# Patient Record
Sex: Female | Born: 1935 | Race: White | Hispanic: No | Marital: Married | Smoking: Former smoker
Health system: Southern US, Community
[De-identification: ages and names within clinical notes are randomized; demographics above are authoritative.]

## PROBLEM LIST (undated history)

## (undated) DIAGNOSIS — I639 Cerebral infarction, unspecified: Secondary | ICD-10-CM

## (undated) DIAGNOSIS — R519 Headache, unspecified: Secondary | ICD-10-CM

## (undated) DIAGNOSIS — R51 Headache: Secondary | ICD-10-CM

## (undated) DIAGNOSIS — F419 Anxiety disorder, unspecified: Secondary | ICD-10-CM

## (undated) DIAGNOSIS — E039 Hypothyroidism, unspecified: Secondary | ICD-10-CM

## (undated) DIAGNOSIS — K219 Gastro-esophageal reflux disease without esophagitis: Secondary | ICD-10-CM

## (undated) DIAGNOSIS — M1990 Unspecified osteoarthritis, unspecified site: Secondary | ICD-10-CM

---

## 2008-09-12 ENCOUNTER — Encounter: Admission: RE | Admit: 2008-09-12 | Discharge: 2008-09-12 | Payer: Self-pay

## 2016-07-15 ENCOUNTER — Other Ambulatory Visit: Payer: Self-pay | Admitting: Neurosurgery

## 2016-07-31 ENCOUNTER — Encounter (HOSPITAL_COMMUNITY)
Admission: RE | Admit: 2016-07-31 | Discharge: 2016-07-31 | Disposition: A | Discharge status: Home / Self Care | Payer: Medicare HMO | Source: Ambulatory Visit | Attending: Neurosurgery | Admitting: Neurosurgery

## 2016-07-31 ENCOUNTER — Encounter (HOSPITAL_COMMUNITY): Payer: Self-pay | Admitting: *Deleted

## 2016-07-31 DIAGNOSIS — M48061 Spinal stenosis, lumbar region without neurogenic claudication: Secondary | ICD-10-CM | POA: Insufficient documentation

## 2016-07-31 DIAGNOSIS — Z01818 Encounter for other preprocedural examination: Secondary | ICD-10-CM | POA: Diagnosis present

## 2016-07-31 HISTORY — DX: Headache, unspecified: R51.9

## 2016-07-31 HISTORY — DX: Hypothyroidism, unspecified: E03.9

## 2016-07-31 HISTORY — DX: Anxiety disorder, unspecified: F41.9

## 2016-07-31 HISTORY — DX: Gastro-esophageal reflux disease without esophagitis: K21.9

## 2016-07-31 HISTORY — DX: Headache: R51

## 2016-07-31 HISTORY — DX: Unspecified osteoarthritis, unspecified site: M19.90

## 2016-07-31 HISTORY — DX: Cerebral infarction, unspecified: I63.9

## 2016-07-31 LAB — BASIC METABOLIC PANEL
ANION GAP: 6 (ref 5–15)
BUN: 21 mg/dL — ABNORMAL HIGH (ref 6–20)
CALCIUM: 9 mg/dL (ref 8.9–10.3)
CO2: 21 mmol/L — ABNORMAL LOW (ref 22–32)
CREATININE: 0.83 mg/dL (ref 0.44–1.00)
Chloride: 108 mmol/L (ref 101–111)
Glucose, Bld: 94 mg/dL (ref 65–99)
Potassium: 4 mmol/L (ref 3.5–5.1)
Sodium: 135 mmol/L (ref 135–145)

## 2016-07-31 LAB — TYPE AND SCREEN
ABO/RH(D): A POS
Antibody Screen: NEGATIVE

## 2016-07-31 LAB — CBC
HCT: 35.9 % — ABNORMAL LOW (ref 36.0–46.0)
Hemoglobin: 11.8 g/dL — ABNORMAL LOW (ref 12.0–15.0)
MCH: 33 pg (ref 26.0–34.0)
MCHC: 32.9 g/dL (ref 30.0–36.0)
MCV: 100.3 fL — ABNORMAL HIGH (ref 78.0–100.0)
Platelets: 189 10*3/uL (ref 150–400)
RBC: 3.58 MIL/uL — ABNORMAL LOW (ref 3.87–5.11)
RDW: 13.6 % (ref 11.5–15.5)
WBC: 6.5 10*3/uL (ref 4.0–10.5)

## 2016-07-31 LAB — ABO/RH: ABO/RH(D): A POS

## 2016-07-31 LAB — SURGICAL PCR SCREEN
MRSA, PCR: NEGATIVE
Staphylococcus aureus: NEGATIVE

## 2016-07-31 NOTE — Pre-Procedure Instructions (Signed)
Lonni FixMargaret D Burlison  07/31/2016      [id number] - [place], [place] - [id number] [place] [id number] Phone: [phone] Fax: [phone]    Your procedure is scheduled on   Thursday  08/07/16  [facility] at 1000 A.M.  Call this number if you have problems the morning of surgery:  [phone]   Remember:  Do not eat food or drink liquids after midnight.  Take these medicines the morning of surgery with A SIP OF WATER   -  ARMOUR THYROID, OMEPRAZOLE      7 days prior to surgery STOP taking any Aspirin, Aleve, Naproxen, Ibuprofen, Motrin, Advil, Goody's, BC's, all herbal medications, fish oil, and all vitamins, SULFASALAZINE, CELECOXIB (CELEBREX)   Do not wear jewelry, make-up or nail polish.  Do not wear lotions, powders, or perfumes, or deoderant.  Do not shave 48 hours prior to surgery.  Men may shave face and neck.  Do not bring valuables to the hospital.  [facility] is not responsible for any belongings or valuables.  Contacts, dentures or bridgework may not be worn into surgery.  Leave your suitcase in the car.  After surgery it may be brought to your room.  For patients admitted to the hospital, discharge time will be determined by your treatment team.  Patients discharged the day of surgery will not be allowed to drive home.   Name and phone number of your driver:    Special instructions:  [place] - Preparing for Surgery  Before surgery, you can play an important role.  Because skin is not sterile, your skin needs to be as free of germs as possible.  You can reduce the number of germs on you skin by washing with CHG (chlorahexidine gluconate) soap before surgery.  CHG is an antiseptic cleaner which kills germs and bonds with the skin to continue killing germs even after washing.  Please DO NOT use if you have an allergy to CHG or antibacterial soaps.  If your skin becomes reddened/irritated stop using  the CHG and inform your nurse when you arrive at Short Stay.  Do not shave (including legs and underarms) for at least 48 hours prior to the first CHG shower.  You may shave your face.  Please follow these instructions carefully:   1.  Shower with CHG Soap the night before surgery and the                                morning of Surgery.  2.  If you choose to wash your hair, wash your hair first as usual with your       normal shampoo.  3.  After you shampoo, rinse your hair and body thoroughly to remove the                      Shampoo.  4.  Use CHG as you would any other liquid soap.  You can apply chg directly       to the skin and wash gently with scrungie or a clean washcloth.  5.  Apply the CHG Soap to your body ONLY FROM THE NECK DOWN.        Do not use on open wounds or open sores.  Avoid contact with your eyes,       ears, mouth and genitals (private parts).  Wash genitals (private parts)       with your normal soap.  6.  Wash thoroughly, paying special attention to the area where your surgery        will be performed.  7.  Thoroughly rinse your body with warm water from the neck down.  8.  DO NOT shower/wash with your normal soap after using and rinsing off       the CHG Soap.  9.  Pat yourself dry with a clean towel.            10.  Wear clean pajamas.            11.  Place clean sheets on your bed the night of your first shower and do not        sleep with pets.  Day of Surgery  Do not apply any lotions/deoderants the morning of surgery.  Please wear clean clothes to the hospital/surgery center.    Please read over the following fact sheets that you were given. Pain Booklet, MRSA Information and Surgical Site Infection Prevention

## 2016-08-07 ENCOUNTER — Inpatient Hospital Stay (HOSPITAL_COMMUNITY)
Admission: RE | Admit: 2016-08-07 | Discharge: 2016-08-11 | DRG: 460 | Disposition: A | Discharge status: Home Health | Payer: Medicare HMO | Source: Ambulatory Visit | Attending: Neurosurgery | Admitting: Neurosurgery

## 2016-08-07 ENCOUNTER — Inpatient Hospital Stay (HOSPITAL_COMMUNITY)
Admission: RE | Disposition: A | Discharge status: Home Health | Payer: Self-pay | Source: Ambulatory Visit | Attending: Neurosurgery

## 2016-08-07 ENCOUNTER — Inpatient Hospital Stay (HOSPITAL_COMMUNITY): Payer: Medicare HMO | Admitting: Certified Registered"

## 2016-08-07 ENCOUNTER — Encounter (HOSPITAL_COMMUNITY): Payer: Self-pay | Admitting: *Deleted

## 2016-08-07 ENCOUNTER — Inpatient Hospital Stay (HOSPITAL_COMMUNITY): Payer: Medicare HMO

## 2016-08-07 DIAGNOSIS — R339 Retention of urine, unspecified: Secondary | ICD-10-CM | POA: Diagnosis not present

## 2016-08-07 DIAGNOSIS — M48062 Spinal stenosis, lumbar region with neurogenic claudication: Secondary | ICD-10-CM | POA: Diagnosis present

## 2016-08-07 DIAGNOSIS — Z993 Dependence on wheelchair: Secondary | ICD-10-CM | POA: Diagnosis not present

## 2016-08-07 DIAGNOSIS — K219 Gastro-esophageal reflux disease without esophagitis: Secondary | ICD-10-CM | POA: Diagnosis present

## 2016-08-07 DIAGNOSIS — M549 Dorsalgia, unspecified: Secondary | ICD-10-CM | POA: Diagnosis present

## 2016-08-07 DIAGNOSIS — Z419 Encounter for procedure for purposes other than remedying health state, unspecified: Secondary | ICD-10-CM

## 2016-08-07 DIAGNOSIS — Z87891 Personal history of nicotine dependence: Secondary | ICD-10-CM

## 2016-08-07 DIAGNOSIS — E039 Hypothyroidism, unspecified: Secondary | ICD-10-CM | POA: Diagnosis present

## 2016-08-07 DIAGNOSIS — Z8673 Personal history of transient ischemic attack (TIA), and cerebral infarction without residual deficits: Secondary | ICD-10-CM | POA: Diagnosis not present

## 2016-08-07 DIAGNOSIS — M4316 Spondylolisthesis, lumbar region: Secondary | ICD-10-CM

## 2016-08-07 SURGERY — POSTERIOR LUMBAR FUSION 1 LEVEL
Anesthesia: General | Site: Spine Lumbar

## 2016-08-07 MED ORDER — OXYCODONE HCL 5 MG PO TABS
5.0000 mg | ORAL_TABLET | Freq: Four times a day (QID) | ORAL | Status: DC | PRN
  Administered 2016-08-07: 10 mg via ORAL
  Administered 2016-08-08 – 2016-08-10 (×7): 5 mg via ORAL
  Administered 2016-08-10: 10 mg via ORAL
  Administered 2016-08-10: 5 mg via ORAL
  Administered 2016-08-11: 10 mg via ORAL
  Filled 2016-08-07 (×3): qty 1
  Filled 2016-08-07: qty 2
  Filled 2016-08-07: qty 1
  Filled 2016-08-07 (×2): qty 2
  Filled 2016-08-07 (×2): qty 1
  Filled 2016-08-07: qty 2
  Filled 2016-08-07 (×2): qty 1
  Filled 2016-08-07: qty 2

## 2016-08-07 MED ORDER — ROCURONIUM BROMIDE 10 MG/ML (PF) SYRINGE
PREFILLED_SYRINGE | INTRAVENOUS | Status: AC
  Filled 2016-08-07: qty 5

## 2016-08-07 MED ORDER — 0.9 % SODIUM CHLORIDE (POUR BTL) OPTIME
TOPICAL | Status: DC | PRN
  Administered 2016-08-07 (×2): 1000 mL

## 2016-08-07 MED ORDER — HYDROMORPHONE HCL 1 MG/ML IJ SOLN
0.5000 mg | INTRAMUSCULAR | Status: DC | PRN
  Administered 2016-08-08 – 2016-08-10 (×2): 0.5 mg via INTRAVENOUS
  Filled 2016-08-07 (×2): qty 1

## 2016-08-07 MED ORDER — CEFAZOLIN SODIUM-DEXTROSE 2-4 GM/100ML-% IV SOLN
2.0000 g | INTRAVENOUS | Status: AC
  Administered 2016-08-07: 2 g via INTRAVENOUS
  Filled 2016-08-07: qty 100

## 2016-08-07 MED ORDER — SUGAMMADEX SODIUM 200 MG/2ML IV SOLN
INTRAVENOUS | Status: DC | PRN
  Administered 2016-08-07: 150 mg via INTRAVENOUS

## 2016-08-07 MED ORDER — ACETAMINOPHEN 650 MG RE SUPP: 650.0000 mg | RECTAL | Status: DC | PRN

## 2016-08-07 MED ORDER — THROMBIN 5000 UNITS EX SOLR
CUTANEOUS | Status: AC
  Filled 2016-08-07: qty 5000

## 2016-08-07 MED ORDER — PANTOPRAZOLE SODIUM 40 MG IV SOLR
40.0000 mg | Freq: Every day | INTRAVENOUS | Status: DC
  Administered 2016-08-07: 40 mg via INTRAVENOUS
  Filled 2016-08-07: qty 40

## 2016-08-07 MED ORDER — DIAZEPAM 5 MG PO TABS
5.0000 mg | ORAL_TABLET | Freq: Every day | ORAL | Status: DC
  Administered 2016-08-07 – 2016-08-10 (×4): 5 mg via ORAL
  Filled 2016-08-07 (×4): qty 1

## 2016-08-07 MED ORDER — ONDANSETRON HCL 4 MG/2ML IJ SOLN: 4.0000 mg | Freq: Four times a day (QID) | INTRAMUSCULAR | Status: DC | PRN

## 2016-08-07 MED ORDER — SODIUM CHLORIDE 0.9 % IV SOLN
250.0000 mL | INTRAVENOUS | Status: DC
  Administered 2016-08-07: 250 mL via INTRAVENOUS

## 2016-08-07 MED ORDER — ACETAMINOPHEN 325 MG PO TABS
ORAL_TABLET | ORAL | Status: AC
  Administered 2016-08-07: 650 mg via ORAL
  Filled 2016-08-07: qty 2

## 2016-08-07 MED ORDER — LIDOCAINE HCL (CARDIAC) 20 MG/ML IV SOLN
INTRAVENOUS | Status: DC | PRN
  Administered 2016-08-07: 50 mg via INTRAVENOUS

## 2016-08-07 MED ORDER — PROMETHAZINE HCL 25 MG/ML IJ SOLN: 6.2500 mg | INTRAMUSCULAR | Status: DC | PRN

## 2016-08-07 MED ORDER — SULFASALAZINE 500 MG PO TABS
1000.0000 mg | ORAL_TABLET | Freq: Two times a day (BID) | ORAL | Status: DC
  Administered 2016-08-07 – 2016-08-11 (×8): 1000 mg via ORAL
  Filled 2016-08-07 (×8): qty 2

## 2016-08-07 MED ORDER — OXYCODONE HCL 5 MG PO TABS
ORAL_TABLET | ORAL | Status: AC
  Administered 2016-08-07: 10 mg via ORAL
  Filled 2016-08-07: qty 2

## 2016-08-07 MED ORDER — PROPOFOL 10 MG/ML IV BOLUS
INTRAVENOUS | Status: DC | PRN
  Administered 2016-08-07: 110 mg via INTRAVENOUS

## 2016-08-07 MED ORDER — LACTATED RINGERS IV SOLN
INTRAVENOUS | Status: DC
  Administered 2016-08-07 (×2): via INTRAVENOUS

## 2016-08-07 MED ORDER — FENTANYL CITRATE (PF) 250 MCG/5ML IJ SOLN
INTRAMUSCULAR | Status: AC
  Filled 2016-08-07: qty 5

## 2016-08-07 MED ORDER — PANTOPRAZOLE SODIUM 40 MG PO TBEC: 40.0000 mg | DELAYED_RELEASE_TABLET | Freq: Every day | ORAL | Status: DC

## 2016-08-07 MED ORDER — DEXTROSE 5 % IV SOLN
INTRAVENOUS | Status: DC | PRN
  Administered 2016-08-07: 25 ug/min via INTRAVENOUS

## 2016-08-07 MED ORDER — VITAMIN D 1000 UNITS PO TABS
1000.0000 [IU] | ORAL_TABLET | Freq: Every day | ORAL | Status: DC
  Administered 2016-08-07 – 2016-08-11 (×5): 1000 [IU] via ORAL
  Filled 2016-08-07 (×4): qty 1

## 2016-08-07 MED ORDER — CYCLOBENZAPRINE HCL 10 MG PO TABS
10.0000 mg | ORAL_TABLET | Freq: Three times a day (TID) | ORAL | Status: DC | PRN
  Administered 2016-08-07 – 2016-08-09 (×4): 10 mg via ORAL
  Filled 2016-08-07 (×4): qty 1

## 2016-08-07 MED ORDER — PROPOFOL 10 MG/ML IV BOLUS
INTRAVENOUS | Status: AC
  Filled 2016-08-07: qty 20

## 2016-08-07 MED ORDER — FENTANYL CITRATE (PF) 100 MCG/2ML IJ SOLN
INTRAMUSCULAR | Status: DC | PRN
  Administered 2016-08-07 (×3): 50 ug via INTRAVENOUS

## 2016-08-07 MED ORDER — SURGIFOAM 100 EX MISC
CUTANEOUS | Status: DC | PRN
  Administered 2016-08-07 (×2): 20 mL via TOPICAL

## 2016-08-07 MED ORDER — FENTANYL CITRATE (PF) 100 MCG/2ML IJ SOLN: 25.0000 ug | INTRAMUSCULAR | Status: DC | PRN

## 2016-08-07 MED ORDER — FENTANYL CITRATE (PF) 100 MCG/2ML IJ SOLN
INTRAMUSCULAR | Status: AC
  Administered 2016-08-07: 50 ug via INTRAVENOUS
  Filled 2016-08-07: qty 2

## 2016-08-07 MED ORDER — ONDANSETRON HCL 4 MG/2ML IJ SOLN
INTRAMUSCULAR | Status: DC | PRN
  Administered 2016-08-07: 4 mg via INTRAVENOUS

## 2016-08-07 MED ORDER — CHLORHEXIDINE GLUCONATE CLOTH 2 % EX PADS: 6.0000 | MEDICATED_PAD | Freq: Once | CUTANEOUS | Status: DC

## 2016-08-07 MED ORDER — ALUM & MAG HYDROXIDE-SIMETH 200-200-20 MG/5ML PO SUSP: 30.0000 mL | Freq: Four times a day (QID) | ORAL | Status: DC | PRN

## 2016-08-07 MED ORDER — LIDOCAINE-EPINEPHRINE 1 %-1:100000 IJ SOLN
INTRAMUSCULAR | Status: AC
  Filled 2016-08-07: qty 1

## 2016-08-07 MED ORDER — THYROID 120 MG PO TABS
120.0000 mg | ORAL_TABLET | Freq: Every day | ORAL | Status: DC
  Administered 2016-08-08 – 2016-08-11 (×4): 120 mg via ORAL
  Filled 2016-08-07 (×5): qty 1

## 2016-08-07 MED ORDER — PHENOL 1.4 % MT LIQD: 1.0000 | OROMUCOSAL | Status: DC | PRN

## 2016-08-07 MED ORDER — NORTRIPTYLINE HCL 25 MG PO CAPS
100.0000 mg | ORAL_CAPSULE | Freq: Every day | ORAL | Status: DC
  Administered 2016-08-07 – 2016-08-10 (×4): 100 mg via ORAL
  Filled 2016-08-07 (×4): qty 4

## 2016-08-07 MED ORDER — FENTANYL CITRATE (PF) 100 MCG/2ML IJ SOLN
25.0000 ug | INTRAMUSCULAR | Status: DC | PRN
  Administered 2016-08-07 (×3): 50 ug via INTRAVENOUS

## 2016-08-07 MED ORDER — ARTIFICIAL TEARS OPHTHALMIC OINT
TOPICAL_OINTMENT | OPHTHALMIC | Status: DC | PRN
  Administered 2016-08-07: 1 via OPHTHALMIC

## 2016-08-07 MED ORDER — CELECOXIB 200 MG PO CAPS: 200.0000 mg | ORAL_CAPSULE | Freq: Every day | ORAL | Status: DC | PRN

## 2016-08-07 MED ORDER — MENTHOL 3 MG MT LOZG: 1.0000 | LOZENGE | OROMUCOSAL | Status: DC | PRN

## 2016-08-07 MED ORDER — ONDANSETRON HCL 4 MG/2ML IJ SOLN
INTRAMUSCULAR | Status: AC
  Filled 2016-08-07: qty 2

## 2016-08-07 MED ORDER — MEPERIDINE HCL 25 MG/ML IJ SOLN: 6.2500 mg | INTRAMUSCULAR | Status: DC | PRN

## 2016-08-07 MED ORDER — LACTATED RINGERS IV SOLN: INTRAVENOUS | Status: DC

## 2016-08-07 MED ORDER — MIDAZOLAM HCL 2 MG/2ML IJ SOLN
INTRAMUSCULAR | Status: AC
  Filled 2016-08-07: qty 2

## 2016-08-07 MED ORDER — THROMBIN 20000 UNITS EX SOLR
CUTANEOUS | Status: AC
  Filled 2016-08-07: qty 20000

## 2016-08-07 MED ORDER — CEFAZOLIN SODIUM-DEXTROSE 2-4 GM/100ML-% IV SOLN
2.0000 g | Freq: Three times a day (TID) | INTRAVENOUS | Status: AC
  Administered 2016-08-07 – 2016-08-09 (×6): 2 g via INTRAVENOUS
  Filled 2016-08-07 (×6): qty 100

## 2016-08-07 MED ORDER — ACETAMINOPHEN 325 MG PO TABS
650.0000 mg | ORAL_TABLET | ORAL | Status: DC | PRN
  Administered 2016-08-07 – 2016-08-08 (×2): 650 mg via ORAL
  Filled 2016-08-07 (×2): qty 2

## 2016-08-07 MED ORDER — BACITRACIN 50000 UNITS IM SOLR
INTRAMUSCULAR | Status: DC | PRN
  Administered 2016-08-07: 500 mL

## 2016-08-07 MED ORDER — LIDOCAINE 2% (20 MG/ML) 5 ML SYRINGE
INTRAMUSCULAR | Status: AC
  Filled 2016-08-07: qty 5

## 2016-08-07 MED ORDER — COD LIVER OIL PO CAPS: 1.0000 | ORAL_CAPSULE | Freq: Every day | ORAL | Status: DC

## 2016-08-07 MED ORDER — PHENYLEPHRINE HCL 10 MG/ML IJ SOLN
INTRAMUSCULAR | Status: AC
  Filled 2016-08-07: qty 1

## 2016-08-07 MED ORDER — DEXAMETHASONE SODIUM PHOSPHATE 10 MG/ML IJ SOLN
10.0000 mg | INTRAMUSCULAR | Status: DC
  Filled 2016-08-07: qty 1

## 2016-08-07 MED ORDER — VANCOMYCIN HCL 1000 MG IV SOLR
INTRAVENOUS | Status: DC | PRN
  Administered 2016-08-07: 1000 mg via TOPICAL

## 2016-08-07 MED ORDER — BUTALBITAL-APAP-CAFFEINE 50-325-40 MG PO TABS: 1.0000 | ORAL_TABLET | Freq: Four times a day (QID) | ORAL | Status: DC | PRN

## 2016-08-07 MED ORDER — ACETAZOLAMIDE 250 MG PO TABS
250.0000 mg | ORAL_TABLET | Freq: Two times a day (BID) | ORAL | Status: DC
  Administered 2016-08-07 – 2016-08-11 (×8): 250 mg via ORAL
  Filled 2016-08-07 (×8): qty 1

## 2016-08-07 MED ORDER — ONDANSETRON HCL 4 MG PO TABS: 4.0000 mg | ORAL_TABLET | Freq: Four times a day (QID) | ORAL | Status: DC | PRN

## 2016-08-07 MED ORDER — SODIUM CHLORIDE 0.9% FLUSH
3.0000 mL | Freq: Two times a day (BID) | INTRAVENOUS | Status: DC
  Administered 2016-08-07 – 2016-08-09 (×3): 3 mL via INTRAVENOUS
  Administered 2016-08-09 – 2016-08-10 (×2): 10 mL via INTRAVENOUS
  Administered 2016-08-10 – 2016-08-11 (×2): 3 mL via INTRAVENOUS

## 2016-08-07 MED ORDER — SODIUM CHLORIDE 0.9% FLUSH: 3.0000 mL | INTRAVENOUS | Status: DC | PRN

## 2016-08-07 MED ORDER — ROCURONIUM BROMIDE 100 MG/10ML IV SOLN
INTRAVENOUS | Status: DC | PRN
  Administered 2016-08-07 (×3): 50 mg via INTRAVENOUS

## 2016-08-07 MED ORDER — THROMBIN 5000 UNITS EX SOLR
OROMUCOSAL | Status: DC | PRN
  Administered 2016-08-07: 5 mL via TOPICAL

## 2016-08-07 MED ORDER — BUPIVACAINE LIPOSOME 1.3 % IJ SUSP
20.0000 mL | Freq: Once | INTRAMUSCULAR | Status: AC
  Administered 2016-08-07: 20 mL
  Filled 2016-08-07: qty 20

## 2016-08-07 MED ORDER — VANCOMYCIN HCL 1000 MG IV SOLR
INTRAVENOUS | Status: AC
  Filled 2016-08-07: qty 1000

## 2016-08-07 MED ORDER — BUPIVACAINE HCL (PF) 0.25 % IJ SOLN
INTRAMUSCULAR | Status: AC
  Filled 2016-08-07: qty 30

## 2016-08-07 SURGICAL SUPPLY — 81 items
BAG DECANTER FOR FLEXI CONT (MISCELLANEOUS) ×3 IMPLANT
BENZOIN TINCTURE PRP APPL 2/3 (GAUZE/BANDAGES/DRESSINGS) ×3 IMPLANT
BLADE CLIPPER SURG (BLADE) IMPLANT
BLADE SURG 11 STRL SS (BLADE) ×3 IMPLANT
BONE VIVIGEN FORMABLE 5.4CC (Bone Implant) ×3 IMPLANT
BUR CUTTER 7.0 ROUND (BURR) ×3 IMPLANT
BUR MATCHSTICK NEURO 3.0 LAGG (BURR) ×3 IMPLANT
CANISTER SUCT 3000ML PPV (MISCELLANEOUS) ×3 IMPLANT
CAP LOCKING (Cap) ×8 IMPLANT
CAP LOCKING 5.5 CREO (Cap) ×4 IMPLANT
CARTRIDGE OIL MAESTRO DRILL (MISCELLANEOUS) ×1 IMPLANT
CLOSURE WOUND 1/2 X4 (GAUZE/BANDAGES/DRESSINGS) ×2
CONT SPEC 4OZ CLIKSEAL STRL BL (MISCELLANEOUS) ×6 IMPLANT
COVER BACK TABLE 60X90IN (DRAPES) ×3 IMPLANT
DECANTER SPIKE VIAL GLASS SM (MISCELLANEOUS) ×6 IMPLANT
DERMABOND ADVANCED (GAUZE/BANDAGES/DRESSINGS) ×2
DERMABOND ADVANCED .7 DNX12 (GAUZE/BANDAGES/DRESSINGS) ×1 IMPLANT
DIFFUSER DRILL AIR PNEUMATIC (MISCELLANEOUS) ×3 IMPLANT
DRAPE C-ARM 42X72 X-RAY (DRAPES) ×3 IMPLANT
DRAPE C-ARMOR (DRAPES) ×3 IMPLANT
DRAPE HALF SHEET 40X57 (DRAPES) IMPLANT
DRAPE LAPAROTOMY 100X72X124 (DRAPES) ×3 IMPLANT
DRAPE POUCH INSTRU U-SHP 10X18 (DRAPES) ×3 IMPLANT
DRAPE SURG 17X23 STRL (DRAPES) ×3 IMPLANT
DRSG OPSITE 4X5.5 SM (GAUZE/BANDAGES/DRESSINGS) ×3 IMPLANT
DRSG OPSITE POSTOP 4X6 (GAUZE/BANDAGES/DRESSINGS) ×3 IMPLANT
DURAPREP 26ML APPLICATOR (WOUND CARE) ×3 IMPLANT
ELECT REM PT RETURN 9FT ADLT (ELECTROSURGICAL) ×3
ELECTRODE REM PT RTRN 9FT ADLT (ELECTROSURGICAL) ×1 IMPLANT
EVACUATOR 3/16  PVC DRAIN (DRAIN) ×2
EVACUATOR 3/16 PVC DRAIN (DRAIN) ×1 IMPLANT
GAUZE SPONGE 4X4 12PLY STRL (GAUZE/BANDAGES/DRESSINGS) ×3 IMPLANT
GAUZE SPONGE 4X4 16PLY XRAY LF (GAUZE/BANDAGES/DRESSINGS) IMPLANT
GLOVE BIO SURGEON STRL SZ7 (GLOVE) IMPLANT
GLOVE BIO SURGEON STRL SZ8 (GLOVE) ×9 IMPLANT
GLOVE BIOGEL PI IND STRL 7.0 (GLOVE) ×2 IMPLANT
GLOVE BIOGEL PI IND STRL 8 (GLOVE) ×4 IMPLANT
GLOVE BIOGEL PI IND STRL 8.5 (GLOVE) ×1 IMPLANT
GLOVE BIOGEL PI INDICATOR 7.0 (GLOVE) ×4
GLOVE BIOGEL PI INDICATOR 8 (GLOVE) ×8
GLOVE BIOGEL PI INDICATOR 8.5 (GLOVE) ×2
GLOVE ECLIPSE 7.5 STRL STRAW (GLOVE) ×9 IMPLANT
GLOVE EXAM NITRILE LRG STRL (GLOVE) IMPLANT
GLOVE EXAM NITRILE XL STR (GLOVE) IMPLANT
GLOVE EXAM NITRILE XS STR PU (GLOVE) IMPLANT
GLOVE INDICATOR 8.5 STRL (GLOVE) ×6 IMPLANT
GLOVE SURG SS PI 6.5 STRL IVOR (GLOVE) ×6 IMPLANT
GOWN STRL REUS W/ TWL LRG LVL3 (GOWN DISPOSABLE) ×1 IMPLANT
GOWN STRL REUS W/ TWL XL LVL3 (GOWN DISPOSABLE) ×3 IMPLANT
GOWN STRL REUS W/TWL 2XL LVL3 (GOWN DISPOSABLE) ×12 IMPLANT
GOWN STRL REUS W/TWL LRG LVL3 (GOWN DISPOSABLE) ×2
GOWN STRL REUS W/TWL XL LVL3 (GOWN DISPOSABLE) ×6
HEMOSTAT POWDER KIT SURGIFOAM (HEMOSTASIS) ×3 IMPLANT
KIT BASIN OR (CUSTOM PROCEDURE TRAY) ×3 IMPLANT
KIT INFUSE XX SMALL 0.7CC (Orthopedic Implant) ×3 IMPLANT
KIT ROOM TURNOVER OR (KITS) ×3 IMPLANT
NEEDLE HYPO 21X1.5 SAFETY (NEEDLE) ×3 IMPLANT
NEEDLE HYPO 25X1 1.5 SAFETY (NEEDLE) ×3 IMPLANT
NS IRRIG 1000ML POUR BTL (IV SOLUTION) ×6 IMPLANT
OIL CARTRIDGE MAESTRO DRILL (MISCELLANEOUS) ×3
PACK LAMINECTOMY NEURO (CUSTOM PROCEDURE TRAY) ×3 IMPLANT
PAD ARMBOARD 7.5X6 YLW CONV (MISCELLANEOUS) ×15 IMPLANT
PATTIES SURGICAL 1X1 (DISPOSABLE) ×3 IMPLANT
ROD 40MM SPINAL (Rod) ×6 IMPLANT
SCREW CORT CREO 6.0-5.0X30MM (Screw) ×6 IMPLANT
SHAFT CREO 30MM (Neuro Prosthesis/Implant) ×6 IMPLANT
SPACER SUSTAIN 12X26 10MM (Spacer) ×6 IMPLANT
SPONGE LAP 4X18 X RAY DECT (DISPOSABLE) IMPLANT
SPONGE SURGIFOAM ABS GEL 100 (HEMOSTASIS) ×6 IMPLANT
STRIP CLOSURE SKIN 1/2X4 (GAUZE/BANDAGES/DRESSINGS) ×4 IMPLANT
SUT VIC AB 0 CT1 18XCR BRD8 (SUTURE) ×1 IMPLANT
SUT VIC AB 0 CT1 8-18 (SUTURE) ×2
SUT VIC AB 2-0 CT1 18 (SUTURE) ×3 IMPLANT
SUT VIC AB 4-0 PS2 27 (SUTURE) ×3 IMPLANT
SYR CONTROL 10ML LL (SYRINGE) ×3 IMPLANT
SYRINGE 20CC LL (MISCELLANEOUS) ×3 IMPLANT
TOWEL GREEN STERILE (TOWEL DISPOSABLE) ×2 IMPLANT
TOWEL GREEN STERILE FF (TOWEL DISPOSABLE) ×3 IMPLANT
TRAY FOLEY W/METER SILVER 16FR (SET/KITS/TRAYS/PACK) ×3 IMPLANT
TULIP CREP AMP 5.5MM (Orthopedic Implant) ×6 IMPLANT
WATER STERILE IRR 1000ML POUR (IV SOLUTION) ×3 IMPLANT

## 2016-08-07 NOTE — H&P (Signed)
Tricia FixMargaret D Castillo is an 83 y.o. female.   Chief Complaint: Back pain and neurogenic claudication HPI: Patient is a very pleasant 83 year old female is a progress worsening back pain and neurogenic claudication with difficulty ambulating to wear she's become wheelchair bound. Workup has revealed severe spinal stenosis with complete CSF block at L4-5. Due to her progressive clinical syndrome imaging findings and failure conservative treatment I recommended decompression stable position procedure at L4-5. I've extensively gone over the risks and benefits of the operation with the patient as well as perioperative course expectations of outcome and alternatives surgery and she understands and agrees to proceed forward.  Past Medical History:  Diagnosis Date  . Anxiety   . Arthritis   . GERD (gastroesophageal reflux disease)   . Headache   . Hypothyroidism   . Stroke (HCC)    10-15 YRS AGO     SEEN ON MRI  POSSIBLY HAD SMALL STROKE     No past surgical history on file.  No family history on file. Social History:  reports that she has quit smoking. She does not have any smokeless tobacco history on file. She reports that she does not drink alcohol or use drugs.  Allergies:  Allergies  Allergen Reactions  . No Known Allergies     Medications Prior to Admission  Medication Sig Dispense Refill  . acetaZOLAMIDE (DIAMOX) 250 MG tablet Take 250 mg by mouth 2 (two) times daily.    Mack Guise. ARMOUR THYROID 120 MG tablet Take 120 mg by mouth daily before breakfast.    . butalbital-acetaminophen-caffeine (FIORICET, ESGIC) 50-325-40 MG tablet Take 1 tablet by mouth every 6 (six) hours as needed. For migraine pain/headache    . CALCIUM PO Take 1 tablet by mouth daily.    . celecoxib (CELEBREX) 200 MG capsule Take 200 mg by mouth daily as needed. For arthritis pain.  4  . Cholecalciferol (VITAMIN D3 PO) Take 1 tablet by mouth daily.    Marland Kitchen. Cod Liver Oil CAPS Take 1 capsule by mouth daily.    . diazepam  (VALIUM) 5 MG tablet Take 5 mg by mouth at bedtime.  3  . Multiple Vitamins-Minerals (CENTRUM SILVER PO) Take 1 tablet by mouth daily.    . Multiple Vitamins-Minerals (OPTIVITE PO) Take 1 tablet by mouth daily.    . nortriptyline (PAMELOR) 50 MG capsule Take 100 mg by mouth at bedtime.  9  . omeprazole (PRILOSEC) 20 MG capsule Take 20 mg by mouth every morning. 4 hrs apart from armour thyroid    . sulfaSALAzine (AZULFIDINE) 500 MG tablet Take 1,000 mg by mouth 2 (two) times daily.    Marland Kitchen. VITAMIN E PO Take 1 capsule by mouth daily.      No results found for this or any previous visit (from the past 48 hour(s)). No results found.  Review of Systems  Musculoskeletal: Positive for back pain, joint pain and myalgias.  Neurological: Positive for tingling and sensory change.    There were no vitals taken for this visit. Physical Exam  Constitutional: She is oriented to person, place, and time. She appears well-developed and well-nourished.  HENT:  Head: Normocephalic.  Eyes: Pupils are equal, round, and reactive to light.  Neck: Normal range of motion.  Respiratory: Effort normal.  GI: Soft.  Neurological: She is alert and oriented to person, place, and time. She has normal strength. GCS eye subscore is 4. GCS verbal subscore is 5. GCS motor subscore is 6.  Strength is 5 out of  5 iliopsoas, quads, hamstrings, gastric, and tibialis, EHL.     Assessment/Plan 83 year-old presents for an L4-5 posterior lumbar interbody fusion  Keiko Myricks P, MD 08/07/2016, 10:45 AM

## 2016-08-07 NOTE — Anesthesia Procedure Notes (Signed)
Procedure Name: Intubation Date/Time: 08/07/2016 11:28 AM Performed by: Willeen Cass P Pre-anesthesia Checklist: Patient identified, Emergency Drugs available, Suction available and Patient being monitored Patient Re-evaluated:Patient Re-evaluated prior to inductionOxygen Delivery Method: Circle System Utilized Preoxygenation: Pre-oxygenation with 100% oxygen Intubation Type: IV induction Ventilation: Mask ventilation without difficulty Laryngoscope Size: Mac and 3 Grade View: Grade I Tube type: Oral Tube size: 7.0 mm Number of attempts: 1 Airway Equipment and Method: Stylet and Oral airway Placement Confirmation: ETT inserted through vocal cords under direct vision,  positive ETCO2 and breath sounds checked- equal and bilateral Secured at: 22 cm Tube secured with: Tape Dental Injury: Teeth and Oropharynx as per pre-operative assessment

## 2016-08-07 NOTE — Progress Notes (Signed)
Pt admitted to 5M1 from PACU.  She is alert and oriented.  She denies pain at present, but is nauseous and vomited x 1.  Dressing CDI. Hemovac and foley draining.  Family at bedside.

## 2016-08-07 NOTE — Anesthesia Postprocedure Evaluation (Signed)
Anesthesia Post Note  Patient: Tricia Castillo  Procedure(s) Performed: Procedure(s) (LRB): POSTERIOR LUMBAR INTERBODY FUSION LUMBAR FOUR-FIVE (N/A)  Patient location during evaluation: PACU Anesthesia Type: General Level of consciousness: awake and alert Pain management: pain level controlled Vital Signs Assessment: post-procedure vital signs reviewed and stable Respiratory status: spontaneous breathing, nonlabored ventilation, respiratory function stable and patient connected to nasal cannula oxygen Cardiovascular status: blood pressure returned to baseline and stable Postop Assessment: no signs of nausea or vomiting Anesthetic complications: no       Last Vitals:  Vitals:   08/07/16 1615 08/07/16 1630  BP:  134/62  Pulse: 69 68  Resp: 13 16  Temp:                    Shelton SilvasKevin D Caralee Morea

## 2016-08-07 NOTE — Transfer of Care (Signed)
Immediate Anesthesia Transfer of Care Note  Patient: Lonni FixMargaret D Naves  Procedure(s) Performed: Procedure(s): POSTERIOR LUMBAR INTERBODY FUSION LUMBAR FOUR-FIVE (N/A)  Patient Location: PACU  Anesthesia Type:General  Level of Consciousness: awake, alert , oriented and patient cooperative  Airway & Oxygen Therapy: Patient Spontanous Breathing and Patient connected to nasal cannula oxygen  Post-op Assessment: Report given to RN and Post -op Vital signs reviewed and stable  Post vital signs: Reviewed and stable  Last Vitals:  Vitals:   08/07/16 1046  BP: (!) 147/85  Pulse: 89  Resp: 18  Temp: 36.5 C    Last Pain:  Vitals:   08/07/16 1047  TempSrc:   PainSc: 5       Patients Stated Pain Goal: 3 (08/07/16 1047)  Complications: No apparent anesthesia complications

## 2016-08-07 NOTE — Op Note (Signed)
Preoperative diagnosis: Grade 1 spondylolisthesis L4-5 with severe spinal stenosis and bilateral L4-L5 radicular use with neurogenic claudication  Postoperative diagnosis: Same  Procedure: #1 decompressive lumbar laminectomy L4-5 in excess and require more workup would be needed with a standard interbody fusion with complete medial facetectomies radical foraminotomies of the L4 and L5 nerve roots.  #2 posterior lumbar interbody fusion L4-5 utilizing the globus Peek cages packed with locally harvested our graft mixed with vivigen  #3 cortical screw fixation L4-5 using the globus Creo modular cortical screws  Surgeon:  Assistant: Maeola HarmanJoseph Stern anesthesia: Gen.  EBL: Minimal  History of present illness: Patient is a very pleasant 83 year old female who is at long same back and bilateral leg pain with neurogenic claudication and severe spinal stenosis and a virtual complete myelographic block by MRI criteria. Workup has revealed severe spinal stenosis and block at L4-5 with slight listhesis. 2 patient take conservative treatment imaging findings and progressive clinical syndrome I recommended decompression stabilization procedure at L4-5. I extensively reviewed the risks and benefits of the operation with the patient as well as perioperative course expectations of outcome and alternatives of surgery and she and the family understood and agreed to C4.  Operative procedure: Patient regarding the ER was induced under general anesthesia and positioned prone on the Wilson frame and her back was prepped and draped in routine sterile fashion after infiltration of 10 mL lidocaine with epi midline incision was made and Bovie electrocautery was used to dissect through the subcutaneous tissue and Subperiosteal Dissection Was Carried out on the Lamina of L4 and L5. Intraoperative X-Ray Confirmed Identification of the of the Appropriate Level. At this point the spinous process of L4 was removed the central  decompression was begun there was marked stenosis and shingling of the L4 lamina over the L5 lamina after [place] after complete central decompression was performed completely facetectomies were performed I dissected the dura off of the undersurface of medial facets that was causing severe hourglass compression of thecal sac and a large degenerated synovial cyst on the patient's right side causing marked stenosis. All this was removed and the thecal sac was decompressed both L4 and L5 foramen were widely decompressed decompressing for the 5 roots. At this point attempts to communicate interbody work on aggressive disc space cleanout was carried out bilaterally and utilizing sequential distractions with 11 distractor blades a selected 10 mm cages packed with local autograft mixed and inserted a prior contralateral cage insertion packed centrally the autograft mix as well as BMP. Then after all the implants were placed well and confirmed by fluoroscopy this also reduced the deformity. I placed cortical screws in routine fashion with 30 mm screws. I then anchored just assembled the heads and placed the rods slightly compress the L4-L5 screw inspected the foramina to confirm patency leg Gelfoam over the dura place a large Hemovac drain and sprinkled vancomycin powder and injected experell on the fascia. The wound was then closed with interrupted Vicryl and running 4 subcuticular Dermabond benzoin Steri-Strips and sterile dressings applied patient to cover stable condition. At the end of the case all needle counts and sponge counts were correct.

## 2016-08-07 NOTE — Anesthesia Preprocedure Evaluation (Addendum)
Anesthesia Evaluation  Patient identified by MRN, date of birth, ID band Patient awake    Reviewed: Allergy & Precautions, NPO status , Patient's Chart, lab work & pertinent test results  Airway Mallampati: II       Dental  (+) Missing, Dental Advisory Given, Partial Upper   Pulmonary neg pulmonary ROS, former smoker,    breath sounds clear to auscultation       Cardiovascular negative cardio ROS   Rhythm:Regular Rate:Normal     Neuro/Psych  Headaches, Anxiety CVA negative neurological ROS     GI/Hepatic Neg liver ROS, GERD  Medicated,  Endo/Other  Hypothyroidism   Renal/GU negative Renal ROS  negative genitourinary   Musculoskeletal  (+) Arthritis , Osteoarthritis,    Abdominal   Peds negative pediatric ROS (+)  Hematology negative hematology ROS (+)   Anesthesia Other Findings Day of surgery medications reviewed with the patient.  Reproductive/Obstetrics negative OB ROS                            Anesthesia Physical Anesthesia Plan  ASA: II  Anesthesia Plan: General   Post-op Pain Management:    Induction: Intravenous  Airway Management Planned: Oral ETT  Additional Equipment:   Intra-op Plan:   Post-operative Plan: Extubation in OR  Informed Consent: I have reviewed the patients History and Physical, chart, labs and discussed the procedure including the risks, benefits and alternatives for the proposed anesthesia with the patient or authorized representative who has indicated his/her understanding and acceptance.   Dental advisory given  Plan Discussed with: CRNA  Anesthesia Plan Comments:         Anesthesia Quick Evaluation

## 2016-08-08 MED ORDER — PANTOPRAZOLE SODIUM 40 MG PO TBEC
40.0000 mg | DELAYED_RELEASE_TABLET | Freq: Every day | ORAL | Status: DC
  Administered 2016-08-08 – 2016-08-10 (×3): 40 mg via ORAL
  Filled 2016-08-08 (×3): qty 1

## 2016-08-08 MED FILL — Thrombin For Soln 20000 Unit: CUTANEOUS | Qty: 1 | Status: AC

## 2016-08-08 NOTE — Evaluation (Signed)
Physical Therapy Evaluation Patient Details Name: Tricia Castillo MRN: [id number] DOB: 1933-07-01 Today's Date: 08/08/2016   History of Present Illness  Pt is an 83 y/o female s/p L4-L5 PLIF. PMH including but not limited to  Clinical Impression  Pt presented supine in bed with HOB elevated, awake and willing to participate in therapy session. Prior to admission, pt reported that she was mod I with functional mobility with use of SPC to ambulate within her home. Pt also reporting that she was independent with ADLs. Pt lives in a single-level house with a level entry with her husband who can provide 24/7 supervision. Pt requires min A for bed mobility, min guard for transfers and min guard with ambulation using a RW. Pt would continue to benefit from skilled physical therapy services at this time while admitted and after d/c to address the below listed limitations in order to improve overall safety and independence with functional mobility.     Follow Up Recommendations Home health PT;Supervision/Assistance - 24 hour    Equipment Recommendations  None recommended by PT;Other (comment) (pt has all necessary DME at home)    Recommendations for Other Services       Precautions / Restrictions Precautions Precautions: Fall;Back Precaution Booklet Issued: Yes (comment) Precaution Comments: PT reviewed 3/3 back precautions and log roll technique for bed mobility with pt. Required Braces or Orthoses:  ("no brace needed" orders) Restrictions Weight Bearing Restrictions: No      Mobility  Bed Mobility Overal bed mobility: Needs Assistance Bed Mobility: Rolling;Sidelying to Sit Rolling: Min guard Sidelying to sit: Min assist       General bed mobility comments: increased time and effort, vc'ing for technique, use of bed rails, min A to elevate trunk to achieve sitting EOB  Transfers Overall transfer level: Needs assistance Equipment used: Rolling walker (2 wheeled) Transfers: Sit  to/from [employer] Sit to Stand: Min guard Stand pivot transfers: Min guard       General transfer comment: increased time, good hand placement, min guard for safety with vc'ing for postural corrections  Ambulation/Gait Ambulation/Gait assistance: Min guard Ambulation Distance (Feet): 2 Feet Assistive device: Rolling walker (2 wheeled) Gait Pattern/deviations: Step-to pattern;Step-through pattern;Decreased step length - right;Decreased step length - left;Decreased stride length;Shuffle;Trunk flexed Gait velocity: decreased Gait velocity interpretation: Below normal speed for age/gender General Gait Details: modest instability but no overt LOB or need for physical assistance. pt with shuffling gait, minimal foot clearance and flexed trunk. VC'ing for postural corrections  Stairs            Wheelchair Mobility    Modified Rankin (Stroke Patients Only)       Balance Overall balance assessment: Needs assistance Sitting-balance support: Feet supported Sitting balance-Leahy Scale: Fair     Standing balance support: During functional activity;Bilateral upper extremity supported Standing balance-Leahy Scale: Poor Standing balance comment: pt reliant on bilateral UEs on RW                             Pertinent Vitals/Pain Pain Assessment: 0-10 Pain Score: 5  Pain Location: back Pain Descriptors / Indicators: Sore Pain Intervention(s): Monitored during session;Repositioned    Home Living Family/patient expects to be discharged to:: Private residence Living Arrangements: Spouse/significant other Available Help at Discharge: Family;Available 24 hours/day Type of Home: House Home Access: Level entry     Home Layout: One level Home Equipment: Walker - 2 wheels;Cane - single point;Bedside commode;Shower seat;Wheelchair -  manual      Prior Function Level of Independence: Independent with assistive device(s)         Comments: pt reported  that she [facility] and had assistance from her husband when ambulating within the community (for safety/comfort). Pt reported that she was independent with ADLs.     Hand Dominance   Dominant Hand: Right    Extremity/Trunk Assessment   Upper Extremity Assessment Upper Extremity Assessment: Defer to OT evaluation    Lower Extremity Assessment Lower Extremity Assessment: Generalized weakness    Cervical / Trunk Assessment Cervical / Trunk Assessment: Other exceptions Cervical / Trunk Exceptions: s/p lumbar sx  Communication   Communication: No difficulties  Cognition Arousal/Alertness: Awake/alert Behavior During Therapy: WFL for tasks assessed/performed Overall Cognitive Status: Within Functional Limits for tasks assessed                                        General Comments      Exercises     Assessment/Plan    PT Assessment Patient needs continued PT services  PT Problem List Decreased strength;Decreased activity tolerance;Decreased balance;Decreased mobility;Decreased coordination;Decreased safety awareness;Decreased knowledge of use of DME;Decreased knowledge of precautions;Pain       PT Treatment Interventions DME instruction;Gait training;Stair training;Functional mobility training;Therapeutic activities;Therapeutic exercise;Balance training;Neuromuscular re-education;Patient/family education    PT Goals (Current goals can be found in the Care Plan section)  Acute Rehab PT Goals Patient Stated Goal: return home PT Goal Formulation: With patient Time For Goal Achievement: 08/22/16 Potential to Achieve Goals: Fair    Frequency Min 5X/week   Barriers to discharge        Co-evaluation               AM-PAC PT "6 Clicks" Daily Activity  Outcome Measure Difficulty turning over in bed (including adjusting bedclothes, sheets and blankets)?: A Little Difficulty moving from lying on back to sitting on the side of  the bed? : Total Difficulty sitting down on and standing up from a chair with arms (e.g., wheelchair, bedside commode, etc,.)?: Total Help needed moving to and from a bed to chair (including a wheelchair)?: A Little Help needed walking in hospital room?: A Little Help needed climbing 3-5 steps with a railing? : A Lot 6 Click Score: 13    End of Session Equipment Utilized During Treatment: Gait belt Activity Tolerance: Patient tolerated treatment well Patient left: in chair;with call bell/phone within reach;with chair alarm set Nurse Communication: Mobility status PT Visit Diagnosis: Other abnormalities of gait and mobility (R26.89);Pain Pain - part of body:  (back)    Time: [id number] PT Time Calculation (min) (ACUTE ONLY): 24 min   Charges:   PT Evaluation $PT Eval Moderate Complexity: 1 Procedure PT Treatments $Therapeutic Activity: 8-22 mins   PT G Codes:        [place]ennifer Shylie Polo, PT, DPT [phone]   Alessandra BevelsJennifer M Garold Sheeler 08/08/2016, 9:10 AM

## 2016-08-08 NOTE — Progress Notes (Signed)
Subjective: Patient reports Patient doing very well condition of back pain but no leg pain manageable  Objective: Vital signs in last 24 hours: Temp:  [97.7 F (36.5 C)-98.7 F (37.1 C)] 98.7 F (37.1 C) (05/25 0519) Pulse Rate:  [68-89] 86 (05/25 0519) Resp:  [12-18] 18 (05/25 0519) BP: (118-161)/(54-85) 118/56 (05/25 0519) SpO2:  [96 %-100 %] 96 % (05/25 0519) Weight:  [61.2 kg (135 lb)] 61.2 kg (135 lb) (05/24 1046)  Intake/Output from previous day: 05/24 0701 - 05/25 0700 In: 1983 [P.O.:240; I.V.:1593; IV Piggyback:50] Out: 2255 [Urine:1930; Drains:150; Blood:175] Intake/Output this shift: No intake/output data recorded.  Strength 5 out of 5 wound clean dry and intact  Lab Results: No results for input(s): WBC, HGB, HCT, PLT in the last 72 hours. BMET No results for input(s): NA, K, CL, CO2, GLUCOSE, BUN, CREATININE, CALCIUM in the last 72 hours.  Studies/Results: Dg Lumbar Spine 2-3 Views  Result Date: 08/07/2016 CLINICAL DATA:  PLIF L4-L5 EXAM: EF lumbar spine two views COMPARISON:  Lumbar spine MR 05/02/2016 FINDINGS: Prior MRI labeled with 5 lumbar vertebra. Submitted images demonstrate BILATERAL pedicle screws with intervening disc prosthesis at L4 and L5. Bones appear diffusely demineralized. No fracture or subluxation. IMPRESSION: Intraoperative images during posterior L4-L5 fusion. Electronically Signed   By: Ulyses SouthwardMark  Boles M.D.   On: 08/07/2016 14:57   Dg C-arm 1-60 Min  Result Date: 08/07/2016 CLINICAL DATA:  Intraoperative fluoroscopy be during PLIF L4-L5 EXAM: DG C-ARM 61-120 MIN COMPARISON:  Lumbar spine two views intraoperative 08/07/2016 FLUOROSCOPY TIME:  0 minutes 38 seconds Images obtained: 2 FINDINGS: Please see lumbar spine two-view exam report. IMPRESSION: Intraoperative fluoroscopy during lumbar fusion. Electronically Signed   By: Ulyses SouthwardMark  Boles M.D.   On: 08/07/2016 15:01    Assessment/Plan: Postoperative day 1 and L4-5 interbody fusion Mobilized today  with physical occupational therapy  LOS: 1 day     Tiny Rietz P 08/08/2016, 8:03 AM

## 2016-08-08 NOTE — Evaluation (Signed)
Occupational Therapy Evaluation Patient Details Name: Tricia Castillo MRN: [id number] DOB: [birth year] Today's Date: 08/08/2016    History of Present Illness Pt is an 83 y/o female s/p L4-L5 PLIF. PMH including but not limited to   Clinical Impression   PTA Pt independent in ADL/IADL and mobility with SPC. Pt currently mod A for LB ADL and min guard assist for mobility with RW. Pt has appropriate DME at home. Please see OT problem list as Pt will benefit from skilled OT in the acute setting to maximize safety and independence in ADL. Pt will require HHOT for follow up for fall prevention, continued education for Pt and caregiver. Next session to focus on AE education. Since husband was not present session please have next OT session with him present to ensure that he is comfortable with caregiver duties.     Follow Up Recommendations  Home health OT;Supervision/Assistance - 24 hour    Equipment Recommendations  None recommended by OT (Pt has appropriate DME)    Recommendations for Other Services       Precautions / Restrictions Precautions Precautions: Fall;Back Precaution Booklet Issued: Yes (comment) Precaution Comments: Pt able to recall 2/3 precautions, re-educated using handout for visual reinforcement Required Braces or Orthoses:  ("no brace needed" orders) Restrictions Weight Bearing Restrictions: No      Mobility Bed Mobility Overal bed mobility: Needs Assistance Bed Mobility: Rolling;Sidelying to Sit Rolling: Min guard Sidelying to sit: Min assist       General bed mobility comments: increased time and effort, vc'ing for technique, use of bed rails, min A to elevate trunk to achieve sitting EOB  Transfers Overall transfer level: Needs assistance Equipment used: Rolling walker (2 wheeled) Transfers: Sit to/from [employer] Sit to Stand: Min guard Stand pivot transfers: Min guard       General transfer comment: vc to brinng hips close to  the EOB, increased time required, vc for hand placement, min guard for safety with vc'ing for postural corrections    Balance Overall balance assessment: Needs assistance Sitting-balance support: Feet supported Sitting balance-Leahy Scale: Fair     Standing balance support: During functional activity;Bilateral upper extremity supported Standing balance-Leahy Scale: Poor Standing balance comment: pt reliant on bilateral UEs on RW                           ADL either performed or assessed with clinical judgement   ADL Overall ADL's : Needs assistance/impaired Eating/Feeding: Modified independent;Sitting Eating/Feeding Details (indicate cue type and reason): in recliner Grooming: Wash/dry hands;Wash/dry face;Set up;Sitting Grooming Details (indicate cue type and reason): in recliner Upper Body Bathing: Moderate assistance;Sitting   Lower Body Bathing: Maximal assistance;Sitting/lateral leans   Upper Body Dressing : Minimal assistance;Sitting   Lower Body Dressing: Moderate assistance;Sit to/from stand   Toilet Transfer: Min Probation officer Details (indicate cue type and reason): simulated with transfer to recliner, Pt did not need to use the bathroom at this time Toileting- Clothing Manipulation and Hygiene: Minimal assistance;Sit to/from stand       Functional mobility during ADLs: Min guard;Rolling walker;Minimal assistance (min A initially for balance) General ADL Comments: Husband not present for evalutation, and will the one providing care for Pt at home. Pt states that he will be able to help with LB bathing/dressing     Vision Baseline Vision/History: Wears glasses Wears Glasses: At all times Patient Visual Report: No change from baseline Vision Assessment?: No apparent visual deficits  Perception     Praxis      Pertinent Vitals/Pain Pain Assessment: 0-10 Pain Score: 4  Pain Location: back Pain Descriptors / Indicators:  Sore Pain Intervention(s): Monitored during session;Limited activity within patient's tolerance;Repositioned     Hand Dominance Right   Extremity/Trunk Assessment Upper Extremity Assessment Upper Extremity Assessment: Generalized weakness;RUE deficits/detail RUE Deficits / Details: Pt reports that her Right arm was completely numb last night, it still feels a "little tingly" functional during eating and for seated grooming tasks. Strength WFL RUE Sensation: decreased light touch   Lower Extremity Assessment Lower Extremity Assessment: Generalized weakness   Cervical / Trunk Assessment Cervical / Trunk Assessment: Other exceptions Cervical / Trunk Exceptions: s/p lumbar sx   Communication Communication Communication: No difficulties   Cognition Arousal/Alertness: Awake/alert Behavior During Therapy: WFL for tasks assessed/performed Overall Cognitive Status: Within Functional Limits for tasks assessed                                     General Comments  Husband not present to determine caregiver abilities    Exercises     Shoulder Instructions      Home Living Family/patient expects to be discharged to:: Private residence Living Arrangements: Spouse/significant other Available Help at Discharge: Family;Available 24 hours/day Type of Home: House Home Access: Level entry     Home Layout: One level     Bathroom Shower/Tub: Tub/shower unit;Walk-in shower   Bathroom Toilet: Standard Bathroom Accessibility: Yes How Accessible: Accessible via walker Home Equipment: Walker - 2 wheels;Cane - single point;Bedside commode;Shower seat;Wheelchair - manual;Grab bars - toilet;Grab bars - tub/shower          Prior Functioning/Environment Level of Independence: Independent with assistive device(s)        Comments: pt reported that she [facility] and had assistance from her husband when ambulating within the community (for  safety/comfort). Pt reported that she was independent with ADLs.        OT Problem List: Decreased strength;Decreased range of motion;Decreased activity tolerance;Impaired balance (sitting and/or standing);Decreased safety awareness;Decreased knowledge of precautions;Pain;Impaired sensation      OT Treatment/Interventions: Self-care/ADL training;DME and/or AE instruction;Therapeutic activities;Patient/family education;Balance training    OT Goals(Current goals can be found in the care plan section) Acute Rehab OT Goals Patient Stated Goal: return home OT Goal Formulation: With patient Time For Goal Achievement: 08/22/16 Potential to Achieve Goals: Good ADL Goals Pt Will Perform Lower Body Bathing: with min guard assist;with adaptive equipment;sitting/lateral leans Pt Will Perform Lower Body Dressing: with min assist;with caregiver independent in assisting;with adaptive equipment;sit to/from stand Pt Will Transfer to Toilet: with supervision;ambulating;grab bars (with RW, caregiver independent in assisting) Pt Will Perform Toileting - Clothing Manipulation and hygiene: with supervision;sit to/from stand Additional ADL Goal #1: Pt will recall 3/3 back precautions at the beginning of the session and need 3 or less vc to maintain during the session Additional ADL Goal #2: Pt will perform bed mobility at supervision level, while maintaining back precautions  OT Frequency: Min 3X/week   Barriers to D/C:            Co-evaluation              AM-PAC PT "6 Clicks" Daily Activity     Outcome Measure Help from another person eating meals?: None Help from another person taking care of personal grooming?: A Little Help from another person toileting,  which includes using toliet, bedpan, or urinal?: A Little Help from another person bathing (including washing, rinsing, drying)?: A Lot Help from another person to put on and taking off regular upper body clothing?: A Little Help from  another person to put on and taking off regular lower body clothing?: A Lot 6 Click Score: 17   End of Session Equipment Utilized During Treatment: Gait belt;Rolling walker Nurse Communication: Mobility status  Activity Tolerance: Patient tolerated treatment well Patient left: in chair;with call bell/phone within reach;with chair alarm set  OT Visit Diagnosis: Unsteadiness on feet (R26.81);Muscle weakness (generalized) (M62.81);Other symptoms and signs involving the nervous system (R29.898);Pain Pain - Right/Left: Right Pain - part of body: Leg (back)                Time: 1610-9604 OT Time Calculation (min): 18 min Charges:  OT General Charges $OT Visit: 1 Procedure OT Evaluation $OT Eval Moderate Complexity: 1 Procedure G-Codes:     Sherryl Manges OTR/L [phone]  Evern Bio Wendle Kina 08/08/2016, 12:39 PM

## 2016-08-09 MED ORDER — WHITE PETROLATUM GEL
Status: AC
  Administered 2016-08-09: 06:00:00
  Filled 2016-08-09: qty 1

## 2016-08-09 MED ORDER — SENNOSIDES-DOCUSATE SODIUM 8.6-50 MG PO TABS
1.0000 | ORAL_TABLET | Freq: Two times a day (BID) | ORAL | Status: DC | PRN
  Administered 2016-08-10 (×2): 1 via ORAL
  Filled 2016-08-09 (×2): qty 1

## 2016-08-09 NOTE — Progress Notes (Signed)
Physical Therapy Treatment Patient Details Name: Tricia Castillo [place] MRN: [id number] DOB: 1934-03-08 Today's Date: 08/09/2016    History of Present Illness Pt is an 83 y/o female s/p L4-L5 PLIF. PMH including but not limited to hypothyroidism, hx of CVA 10-15 years ago.    PT Comments    Pt making good progress with functional mobility, able to ambulate 75' in hallway with min guard for safety with RW. Pt would continue to benefit from skilled physical therapy services at this time while admitted and after Castillo/c to address the below listed limitations in order to improve overall safety and independence with functional mobility.   Follow Up Recommendations  Home health PT;Supervision/Assistance - 24 hour     Equipment Recommendations  None recommended by PT    Recommendations for Other Services       Precautions / Restrictions Precautions Precautions: Fall;Back Precaution Booklet Issued: Yes (comment) Precaution Comments: PT reviewed all precautions and log roll technique with pt throughout session Required Braces or Orthoses:  ("no brace needed" orders) Restrictions Weight Bearing Restrictions: No    Mobility  Bed Mobility Overal bed mobility: Needs Assistance Bed Mobility: Rolling;Sit to Sidelying Rolling: Min guard       Sit to sidelying: Min guard General bed mobility comments: increased time and effort, vc'ing for technique, use of bed rails, min guard for safety  Transfers Overall transfer level: Needs assistance Equipment used: Rolling walker (2 wheeled) Transfers: Sit to/from Stand Sit to Stand: Min guard         General transfer comment: increased time, vc'ing for bilateral hand placement, min guard for safety  Ambulation/Gait Ambulation/Gait assistance: Min guard Ambulation Distance (Feet): 75 Feet Assistive device: Rolling walker (2 wheeled) Gait Pattern/deviations: Step-to pattern;Step-through pattern;Decreased step length - right;Decreased step  length - left;Decreased stride length;Shuffle;Trunk flexed Gait velocity: decreased Gait velocity interpretation: Below normal speed for age/gender General Gait Details: modest instability but no overt LOB or need for physical assistance. pt with shuffling gait, minimal foot clearance and flexed trunk. VC'ing for postural corrections   Stairs            Wheelchair Mobility    Modified Rankin (Stroke Patients Only)       Balance Overall balance assessment: Needs assistance Sitting-balance support: Feet supported Sitting balance-Leahy Scale: Fair     Standing balance support: During functional activity;Bilateral upper extremity supported Standing balance-Leahy Scale: Poor Standing balance comment: pt reliant on bilateral UEs on RW                            Cognition Arousal/Alertness: Awake/alert Behavior During Therapy: WFL for tasks assessed/performed Overall Cognitive Status: Within Functional Limits for tasks assessed                                        Exercises      General Comments        Pertinent Vitals/Pain Pain Assessment: Faces Faces Pain Scale: Hurts even more Pain Location: back Pain Descriptors / Indicators: Sore Pain Intervention(s): Monitored during session;Repositioned;Patient requesting pain meds-RN notified    Home Living                      Prior Function            PT Goals (current goals can now be found in the care plan  section) Acute Rehab PT Goals PT Goal Formulation: With patient Time For Goal Achievement: 08/22/16 Potential to Achieve Goals: Fair Progress towards PT goals: Progressing toward goals    Frequency    Min 5X/week      PT Plan Current plan remains appropriate    Co-evaluation              AM-PAC PT "6 Clicks" Daily Activity  Outcome Measure  Difficulty turning over in bed (including adjusting bedclothes, sheets and blankets)?: A Little Difficulty moving  from lying on back to sitting on the side of the bed? : A Little Difficulty sitting down on and standing up from a chair with arms (e.g., wheelchair, bedside commode, etc,.)?: Total Help needed moving to and from a bed to chair (including a wheelchair)?: A Little Help needed walking in hospital room?: A Little Help needed climbing 3-5 steps with a railing? : A Lot 6 Click Score: 15    End of Session Equipment Utilized During Treatment: Gait belt Activity Tolerance: Patient tolerated treatment well Patient left: in bed;with call bell/phone within reach;with bed alarm set Nurse Communication: Mobility status PT Visit Diagnosis: Other abnormalities of gait and mobility (R26.89);Pain Pain - part of body:  (back)     Time: 1100-1116 PT Time Calculation (min) (ACUTE ONLY): 16 min  Charges:  $Gait Training: 8-22 mins                    G Codes:       [place], [place], [place] [phone]    Alessandra Bevels Jeanmarie Mccowen 08/09/2016, 11:54 AM

## 2016-08-09 NOTE — Progress Notes (Signed)
Placed Foley catheter after pt attempted to void x 3 throughout the shift. 763 bladder scan pre insertion; 700 mL urine drained into foley bag. Pt tolerated well. Reported slight sensation of bladder fullness directly prior to foley placement, but no sensation or urgency throughout the shift. Will continue to monitor.

## 2016-08-09 NOTE — Progress Notes (Signed)
Pt refused to attempt urination; and said she would try "later after lunch". Pt reported no sensation of urinary fullness or urgency. Bladder scan showed 360 mL urine. Will continue to monitor.

## 2016-08-09 NOTE — Progress Notes (Signed)
Vitals:   08/08/16 1429 08/08/16 2202 08/09/16 0138 08/09/16 0633  BP: 135/61 133/73 132/64 140/71  Pulse: 81 87 91 90  Resp: 18 16 16 18   Temp: 98.5 F (36.9 C) 99.3 F (37.4 C) 99.3 F (37.4 C) 98.9 F (37.2 C)  TempSrc: Oral Oral Oral Oral  SpO2: 100% 96% 98% 98%  Weight:      Height:        Patient sitting up, eating. Nursing notes no significant drainage into JP drain, we'll have nursing staff remove it. Have asked him to change IV to saline lock.  Continuing PT and OT. Patient transferring from bed to chair, but not ambulating.Hasn't been yet to the bathroom or into the hallway.  Plan: Will change IV to saline lock. Encourage to work with therapies.  Hewitt ShortsNUDELMAN,ROBERT W, MD 08/09/2016, 10:41 AM

## 2016-08-10 MED ORDER — POLYETHYLENE GLYCOL 3350 17 G PO PACK
17.0000 g | PACK | Freq: Every day | ORAL | Status: DC
  Administered 2016-08-10 – 2016-08-11 (×2): 17 g via ORAL
  Filled 2016-08-10 (×2): qty 1

## 2016-08-10 NOTE — Progress Notes (Signed)
Pt attempted to void x 3 since removing the Foley catheter today at 1100.  Bladder scan showed 316 mL urine. Pt refused catheterization until "later". Reported findings to night RN.

## 2016-08-10 NOTE — Progress Notes (Signed)
Pt seen and examined. Urinary retention requiring Foley placement yesterday Otherwise, she feels well Pain is well controlled  EXAM: Temp:  [97.7 F (36.5 C)-98.8 F (37.1 C)] 98.2 F (36.8 C) (05/27 0911) Pulse Rate:  [77-120] 120 (05/27 0911) Resp:  [16-20] 20 (05/27 0911) BP: (101-149)/(57-76) 101/57 (05/27 0911) SpO2:  [96 %-100 %] 100 % (05/27 0911) Intake/Output      05/26 0701 - 05/27 0700 05/27 0701 - 05/28 0700   P.O. 240 720   Total Intake(mL/kg) 240 (3.9) 720 (11.8)   Urine (mL/kg/hr) 2050 (1.4)    Drains     Total Output 2050     Net -1810 +720         Awake and alert Follows commands throughout MAEW Wound c/d/i  Stable. Continue current care Foley can be removed later today after full 24 hour bladder rest If urinates, can likely d/c tomorrow home

## 2016-08-10 NOTE — Progress Notes (Signed)
Foley catheter d/c'd. Pt tolerated well. 200 mL collected in bag. Pt then attempted to void and move bowels without success. Pt had senna tab in the evening of 5/26 and is drinking prune juice this am for bowels. Pt ambulating from bed to bathroom using walker and tolerating well. Will continue to monitor.

## 2016-08-10 NOTE — Clinical Social Work Note (Signed)
CSW consulted for SNF placement. P/T and O/T rec HHPT. Pt from home with husband who will provide 24/7 supervision. RNCM to follow for d/c needs. CSW signing off as no further social work needs identified. Please reconsult if new social work needs identified.   Corlis HoveJeneya Rondal Vandevelde, LCSWA, LCASA Clinical Social Work (Weekend) [phone]

## 2016-08-10 NOTE — Progress Notes (Signed)
Occupational Therapy Treatment Patient Details Name: Tricia Castillo MRN: [id number] DOB: 07/21/1933 Today's Date: 08/10/2016    History of present illness Pt is an 83 y/o female s/p L4-L5 PLIF. PMH including but not limited to hypothyroidism, hx of CVA 10-15 years ago.   OT comments  Pt progressing towards established goals. Provided education on AE for LB ADLs. Pt performed UE and LB dressing with Min A and VCs to sequence task using AE. Provided handout on back precautions and AE. Pt able to recall 3/3 back precautions. Answered pt and husband questions. Continue to recommend dc home with HHOT to increase pt safety and independence. Will continue to follow acutely while admitted.    Follow Up Recommendations  Home health OT;Supervision/Assistance - 24 hour    Equipment Recommendations  None recommended by OT (Pt has appropriate DME)    Recommendations for Other Services      Precautions / Restrictions Precautions Precautions: Fall;Back Precaution Booklet Issued: Yes (comment) Precaution Comments: reviewed all precautions and log roll technique with pt throughout session (pt able to recall 3/3 precautions without prompting) Required Braces or Orthoses:  ("no brace needed" orders. Pt states she was fitted for brace) Restrictions Weight Bearing Restrictions: No       Mobility Bed Mobility Overal bed mobility: Needs Assistance Bed Mobility: Rolling;Sidelying to Sit Rolling: Min guard Sidelying to sit: Supervision       General bed mobility comments: VC's to remember to log roll when getting up  Transfers Overall transfer level: Needs assistance Equipment used: Rolling walker (2 wheeled) Transfers: Sit to/from Stand Sit to Stand: Supervision Stand pivot transfers: Min guard       General transfer comment: vc'ing for bilateral hand placement when getting up    Balance Overall balance assessment: Needs assistance Sitting-balance support: Feet supported Sitting  balance-Leahy Scale: Fair     Standing balance support: During functional activity;Bilateral upper extremity supported Standing balance-Leahy Scale: Fair Standing balance comment: Pt able to perform LB dressing                            ADL either performed or assessed with clinical judgement   ADL Overall ADL's : Needs assistance/impaired               Lower Body Bathing Details (indicate cue type and reason): Educated on AE for LB ADLs Upper Body Dressing : Sitting;Set up;Supervision/safety Upper Body Dressing Details (indicate cue type and reason): Pt donned bra and shirt. Educated on brace donning/doffing for pt comfort. Pt states that she feels better with brace on Lower Body Dressing: Sit to/from stand;Minimal assistance;With adaptive equipment;Cueing for sequencing;With caregiver independent assisting Lower Body Dressing Details (indicate cue type and reason): Educated on AE and pt donned socks, underwear, and pants.              Functional mobility during ADLs: Min guard;Rolling walker (min A initially for balance) General ADL Comments: Husband present for session. Answered both pt and husband questions     Vision   Vision Assessment?: No apparent visual deficits   Perception     Praxis      Cognition Arousal/Alertness: Awake/alert Behavior During Therapy: WFL for tasks assessed/performed Overall Cognitive Status: Within Functional Limits for tasks assessed  Exercises     Shoulder Instructions       General Comments Husband present for session. Provided handout on back precautions and AE     Pertinent Vitals/ Pain       Pain Assessment: Faces Faces Pain Scale: Hurts little more Pain Location:  low back Pain Descriptors / Indicators: Sore Pain Intervention(s): Monitored during session  Home Living                                          Prior  Functioning/Environment              Frequency  Min 3X/week        Progress Toward Goals  OT Goals(current goals can now be found in the care plan section)  Progress towards OT goals: Progressing toward goals  Acute Rehab OT Goals Patient Stated Goal: return home OT Goal Formulation: With patient Time For Goal Achievement: 08/22/16 Potential to Achieve Goals: Good ADL Goals Pt Will Perform Lower Body Bathing: with min guard assist;with adaptive equipment;sitting/lateral leans Pt Will Perform Lower Body Dressing: with min assist;with caregiver independent in assisting;with adaptive equipment;sit to/from stand Pt Will Transfer to Toilet: with supervision;ambulating;grab bars (with RW, caregiver independent in assisting) Pt Will Perform Toileting - Clothing Manipulation and hygiene: with supervision;sit to/from stand Additional ADL Goal #1: Pt will recall 3/3 back precautions at the beginning of the session and need 3 or less vc to maintain during the session Additional ADL Goal #2: Pt will perform bed mobility at supervision level, while maintaining back precautions  Plan Discharge plan remains appropriate    Co-evaluation                 AM-PAC PT "6 Clicks" Daily Activity     Outcome Measure   Help from another person eating meals?: None Help from another person taking care of personal grooming?: A Little Help from another person toileting, which includes using toliet, bedpan, or urinal?: A Little Help from another person bathing (including washing, rinsing, drying)?: A Lot Help from another person to put on and taking off regular upper body clothing?: A Little Help from another person to put on and taking off regular lower body clothing?: A Lot 6 Click Score: 17    End of Session Equipment Utilized During Treatment: Rolling walker;Back brace  OT Visit Diagnosis: Unsteadiness on feet (R26.81);Muscle weakness (generalized) (M62.81);Other symptoms and signs  involving the nervous system (R29.898);Pain Pain - Right/Left: Right Pain - part of body: Leg (back)   Activity Tolerance Patient tolerated treatment well   Patient Left in chair;with call bell/phone within reach;with chair alarm set;with family/visitor present   Nurse Communication Mobility status        Time: 4098-11911439-1515 OT Time Calculation (min): 36 min  Charges: OT General Charges $OT Visit: 1 Procedure OT Treatments $Self Care/Home Management : 23-37 mins  Rielly Brunn, OTR/L [phone]   Theodoro GristCharis M Mehtab Dolberry 08/10/2016, 4:34 PM

## 2016-08-10 NOTE — Progress Notes (Signed)
Physical Therapy Treatment Patient Details Name: Tricia FixMargaret D Grisby MRN: [id number] DOB: 22-Dec-1933 Today's Date: 08/10/2016    History of Present Illness Pt is an 83 y/o female s/p L4-L5 PLIF. PMH including but not limited to hypothyroidism, hx of CVA 10-15 years ago.    PT Comments    Pt is progressing towards goals. Pt reports her gait pattern is about what it was before surgery except slower now.     Follow Up Recommendations  Home health PT;Supervision for mobility/OOB     Equipment Recommendations  None recommended by PT    Recommendations for Other Services       Precautions / Restrictions Precautions Precautions: Fall;Back Precaution Booklet Issued: Yes (comment) Precaution Comments: PT reviewed all precautions and log roll technique with pt throughout session (pt able to recall 2/3 precautions without prompting) Required Braces or Orthoses:  ("no brace needed" orders. Pt states she was fitted for brace) Restrictions Weight Bearing Restrictions: No    Mobility  Bed Mobility Overal bed mobility: Needs Assistance Bed Mobility: Rolling;Sidelying to Sit Rolling: Supervision Sidelying to sit: Supervision       General bed mobility comments: VC's to remember to log roll when getting up  Transfers Overall transfer level: Needs assistance Equipment used: Rolling walker (2 wheeled) Transfers: Sit to/from Stand Sit to Stand: Supervision         General transfer comment: vc'ing for bilateral hand placement when getting up  Ambulation/Gait Ambulation/Gait assistance: Min guard Ambulation Distance (Feet): 80 Feet Assistive device: Rolling walker (2 wheeled) Gait Pattern/deviations: Step-to pattern;Decreased stride length;Shuffle;Trunk flexed Gait velocity: decreased   General Gait Details: no balance losses. Pt reports she could walk faster prior to surgery but her gait pattern was about the same.  Pt required cues to keep feet inside the walker.   Stairs             Wheelchair Mobility    Modified Rankin (Stroke Patients Only)       Balance                                            Cognition Arousal/Alertness: Awake/alert Behavior During Therapy: WFL for tasks assessed/performed Overall Cognitive Status: Within Functional Limits for tasks assessed                                        Exercises      General Comments General comments (skin integrity, edema, etc.): husband not present. Pt reports she feels like she can go home with his help. She is not concerned about bladder issues, but is concerned about lack of BM.       Pertinent Vitals/Pain Pain Assessment: 0-10 Pain Score: 4  Pain Location:  low back Pain Descriptors / Indicators: Sore Pain Intervention(s): Limited activity within patient's tolerance;Premedicated before session    Home Living                      Prior Function            PT Goals (current goals can now be found in the care plan section) Progress towards PT goals: Progressing toward goals    Frequency    Min 5X/week      PT Plan Current plan remains appropriate  Co-evaluation              AM-PAC PT "6 Clicks" Daily Activity  Outcome Measure  Difficulty turning over in bed (including adjusting bedclothes, sheets and blankets)?: A Little Difficulty moving from lying on back to sitting on the side of the bed? : A Little Difficulty sitting down on and standing up from a chair with arms (e.g., wheelchair, bedside commode, etc,.)?: A Little Help needed moving to and from a bed to chair (including a wheelchair)?: A Little Help needed walking in hospital room?: A Little Help needed climbing 3-5 steps with a railing? : A Lot 6 Click Score: 17    End of Session Equipment Utilized During Treatment: Gait belt Activity Tolerance: Patient tolerated treatment well Patient left: with call bell/phone within reach;in chair;with chair alarm  set Nurse Communication: Mobility status (discussed with NT, not RN) PT Visit Diagnosis: Other abnormalities of gait and mobility (R26.89);Pain Pain - part of body:  (back)     Time: 1610-9604 PT Time Calculation (min) (ACUTE ONLY): 27 min  Charges:  $Gait Training: 23-37 mins                    G Codes:       {Sigurd Pugh [place], PT  [phone] 08/10/2016    Donnella Sham 08/10/2016, 10:05 AM

## 2016-08-11 MED ORDER — CYCLOBENZAPRINE HCL 10 MG PO TABS: 10.0000 mg | ORAL_TABLET | Freq: Three times a day (TID) | ORAL | 0 refills | Status: DC | PRN

## 2016-08-11 MED ORDER — HYDROCODONE-ACETAMINOPHEN 5-325 MG PO TABS
1.0000 | ORAL_TABLET | Freq: Four times a day (QID) | ORAL | 0 refills | Status: DC | PRN
Start: 2016-08-11 — End: 2024-02-08

## 2016-08-11 NOTE — Discharge Summary (Signed)
Physician Discharge Summary  Patient ID: Tricia Castillo MRN: [id number] DOB/AGE: February 24, 1934 83 y.o.  Admit date: 08/07/2016 Discharge date: 08/11/2016  Admission Diagnoses:  Spondylolisthesis at L4-5   Discharge Diagnoses:  Same Active Problems:   Spondylolisthesis at L4-L5 level   Discharged Condition: Stable  Hospital Course:  Tricia Castillo is a 83 y.o. female who was admitted for the below procedure. There were no post operative complications. At time of discharge, pain was well controlled, ambulating with Pt/OT, tolerating po, voiding normal. Ready for discharge. PT/OT rec HH. Agree with that decision.  Treatments: Surgery - Procedure: #1 decompressive lumbar laminectomy L4-5 in excess and require more workup would be needed with a standard interbody fusion with complete medial facetectomies radical foraminotomies of the L4 and L5 nerve roots. #2 posterior lumbar interbody fusion L4-5 utilizing the globus Peek cages packed with locally harvested our graft mixed with vivigen #3 cortical screw fixation L4-5 using the globus Creo modular cortical screws  Discharge Exam: Blood pressure 128/63, pulse 82, temperature 97.8 F (36.6 C), temperature source Oral, resp. rate 17, height 5' (1.524 m), weight 61.2 kg (135 lb), SpO2 97 %. Awake, alert, oriented Speech fluent, appropriate CN grossly intact MAEW Wound c/d/i  Disposition: Final discharge disposition not confirmed  Discharge Instructions    Call MD for:  difficulty breathing, headache or visual disturbances    Complete by:  As directed    Call MD for:  persistant dizziness or light-headedness    Complete by:  As directed    Call MD for:  redness, tenderness, or signs of infection (pain, swelling, redness, odor or green/yellow discharge around incision site)    Complete by:  As directed    Call MD for:  severe uncontrolled pain    Complete by:  As directed    Call MD for:  temperature >100.4    Complete by:   As directed    Diet general    Complete by:  As directed    Driving Restrictions    Complete by:  As directed    Do not drive until given clearance.   Increase activity slowly    Complete by:  As directed    Lifting restrictions    Complete by:  As directed    Do not lift anything >10lbs. Avoid bending and twisting in awkward positions. Avoid bending at the back.     Allergies as of 08/11/2016      Reactions   No Known Allergies       Medication List    TAKE these medications   acetaZOLAMIDE 250 MG tablet Commonly known as:  DIAMOX Take 250 mg by mouth 2 (two) times daily.   ARMOUR THYROID 120 MG tablet Generic drug:  thyroid Take 120 mg by mouth daily before breakfast.   butalbital-acetaminophen-caffeine 50-325-40 MG tablet Commonly known as:  FIORICET, ESGIC Take 1 tablet by mouth every 6 (six) hours as needed. For migraine pain/headache   CALCIUM PO Take 1 tablet by mouth daily.   celecoxib 200 MG capsule Commonly known as:  CELEBREX Take 200 mg by mouth daily as needed. For arthritis pain.   CENTRUM SILVER PO Take 1 tablet by mouth daily.   OPTIVITE PO Take 1 tablet by mouth daily.   Cod Liver Oil Caps Take 1 capsule by mouth daily.   cyclobenzaprine 10 MG tablet Commonly known as:  FLEXERIL Take 1 tablet (10 mg total) by mouth 3 (three) times daily as needed for muscle spasms.  diazepam 5 MG tablet Commonly known as:  VALIUM Take 5 mg by mouth at bedtime.   HYDROcodone-acetaminophen 5-325 MG tablet Commonly known as:  NORCO/VICODIN Take 1 tablet by mouth every 6 (six) hours as needed for moderate pain.   nortriptyline 50 MG capsule Commonly known as:  PAMELOR Take 100 mg by mouth at bedtime.   omeprazole 20 MG capsule Commonly known as:  PRILOSEC Take 20 mg by mouth every morning. 4 hrs apart from armour thyroid   sulfaSALAzine 500 MG tablet Commonly known as:  AZULFIDINE Take 1,000 mg by mouth 2 (two) times daily.   VITAMIN D3 PO Take 1  tablet by mouth daily.   VITAMIN E PO Take 1 capsule by mouth daily.      Follow-up Information    Donalee Citrin, MD. Schedule an appointment as soon as possible for a visit in 1 day(s).   Specialty:  Neurosurgery Why:  To schedule first post operative visit Contact information: [address] [place] [address] [phone]           Signed: Alyson Ingles 08/11/2016, 9:05 AM

## 2016-08-11 NOTE — Progress Notes (Signed)
Pt seen and examined.  Able to void on own earlier. Just woke up - has sensation to urinate. Pain well controlled. Feels well this morning. No concerns  EXAM: Temp:  [97.8 F (36.6 C)-99.2 F (37.3 C)] 97.8 F (36.6 C) (05/28 0428) Pulse Rate:  [82-120] 82 (05/28 0428) Resp:  [16-20] 17 (05/28 0428) BP: (101-147)/(57-71) 128/63 (05/28 0428) SpO2:  [96 %-100 %] 97 % (05/28 0428) Intake/Output      05/27 0701 - 05/28 0700 05/28 0701 - 05/29 0700   P.O. 720    Total Intake(mL/kg) 720 (11.8)    Urine (mL/kg/hr) 1285 (0.9)    Total Output 1285     Net -565          Urine Occurrence 1 x     Awake and alert Follows commands throughout Full strength Wound c/d/i  Plan Appears to be doing well. Urinary retention: resolved  PT/OT rec HH. Dispo planning

## 2016-08-11 NOTE — Progress Notes (Signed)
Physical Therapy Treatment Patient Details Name: Tricia Castillo MRN: [id number] DOB: 1934/03/06 Today's Date: 08/11/2016    History of Present Illness Pt is an 83 y/o female s/p L4-L5 PLIF. PMH including but not limited to hypothyroidism, hx of CVA 10-15 years ago.    PT Comments    Pt progressing slowly towards physical therapy goals. Was able to tolerate ambulation in hall this session however continues to require increased time and cues for RLE. Pt was educated on precautions, brace application and wearing schedule, and general safety upon return home. Will continue to follow.  Follow Up Recommendations  Home health PT;Supervision for mobility/OOB     Equipment Recommendations  None recommended by PT    Recommendations for Other Services       Precautions / Restrictions Precautions Precautions: Fall;Back Precaution Booklet Issued: Yes (comment) Precaution Comments: Pt was cued for precautions during functional mobility. Was able to recall 1/3 precautions when asked at end of session. Pt was re-educated on 3/3 precautions.  Required Braces or Orthoses:  ("no brace needed" orders. Pt states she was fitted for brace) Restrictions Weight Bearing Restrictions: No    Mobility  Bed Mobility Overal bed mobility: Needs Assistance Bed Mobility: Rolling;Sidelying to Sit Rolling: Min guard Sidelying to sit: Min guard       General bed mobility comments: Increased time with VC's for log roll required. HOB flat and bed rails lowered to simulate home environment, which appeared challenging for the patient.   Transfers Overall transfer level: Needs assistance Equipment used: Rolling walker (2 wheeled) Transfers: Sit to/from Stand Sit to Stand: Supervision Stand pivot transfers: Min guard       General transfer comment: vc'ing for bilateral hand placement when getting up  Ambulation/Gait Ambulation/Gait assistance: Min guard Ambulation Distance (Feet): 100  Feet Assistive device: Rolling walker (2 wheeled) Gait Pattern/deviations: Step-to pattern;Decreased stride length;Shuffle;Trunk flexed Gait velocity: decreased Gait velocity interpretation: Below normal speed for age/gender General Gait Details: VC's for improved step length on the R. Pt with externally rotated RLE and demonstrated difficulty advancing. Pt was cued to stop and lead with the RLE when it began getting "left behind".   Stairs            Wheelchair Mobility    Modified Rankin (Stroke Patients Only)       Balance Overall balance assessment: Needs assistance Sitting-balance support: Feet supported Sitting balance-Leahy Scale: Fair     Standing balance support: During functional activity;Bilateral upper extremity supported Standing balance-Leahy Scale: Fair                              Cognition Arousal/Alertness: Awake/alert Behavior During Therapy: WFL for tasks assessed/performed Overall Cognitive Status: Within Functional Limits for tasks assessed                                        Exercises      General Comments        Pertinent Vitals/Pain Pain Assessment: Faces Faces Pain Scale: Hurts little more Pain Location:  low back Pain Descriptors / Indicators: Sore;Operative site guarding Pain Intervention(s): Limited activity within patient's tolerance;Monitored during session;Repositioned    Home Living                      Prior Function  PT Goals (current goals can now be found in the care plan section) Acute Rehab PT Goals Patient Stated Goal: return home PT Goal Formulation: With patient Time For Goal Achievement: 08/22/16 Potential to Achieve Goals: Fair Progress towards PT goals: Progressing toward goals    Frequency    Min 5X/week      PT Plan Current plan remains appropriate    Co-evaluation              AM-PAC PT "6 Clicks" Daily Activity  Outcome Measure   Difficulty turning over in bed (including adjusting bedclothes, sheets and blankets)?: A Little Difficulty moving from lying on back to sitting on the side of the bed? : A Little Difficulty sitting down on and standing up from a chair with arms (e.g., wheelchair, bedside commode, etc,.)?: A Little Help needed moving to and from a bed to chair (including a wheelchair)?: A Little Help needed walking in hospital room?: A Little Help needed climbing 3-5 steps with a railing? : A Lot 6 Click Score: 17    End of Session Equipment Utilized During Treatment: Gait belt Activity Tolerance: Patient tolerated treatment well Patient left: with call bell/phone within reach;in chair;with chair alarm set Nurse Communication: Mobility status PT Visit Diagnosis: Other abnormalities of gait and mobility (R26.89);Pain Pain - part of body:  (back)     Time: 4540-98111138-1203 PT Time Calculation (min) (ACUTE ONLY): 25 min  Charges:  $Gait Training: 23-37 mins                    G Codes:       Conni SlipperLaura Sanvika Cuttino, PT, DPT Acute Rehabilitation Services Pager: [phone]    Marylynn PearsonLaura D Aunesty Tyson 08/11/2016, 12:59 PM

## 2016-08-11 NOTE — Progress Notes (Signed)
Discharge orders received.  Discharge instructions and follow-up appointments reviewed with the patient and her husband.  VSS upon discharge.  IV removed and education complete.  Transported out via wheelchair.   Alexya Mcdaris M, RN 

## 2016-08-11 NOTE — Progress Notes (Signed)
Occupational Therapy Treatment Patient Details Name: CAMIYA VINAL MRN: [id number] DOB: [birth year] Today's Date: 08/11/2016    History of present illness Pt is an 83 y/o female s/p L4-L5 PLIF. PMH including but not limited to hypothyroidism, hx of CVA 10-15 years ago.   OT comments  Pt progressing toward OT goals. She was able to verbalize 3/3 back precautions at beginning of session. She did require min assist and VC's for log roll technique during bed mobility this session. She continues to require min guard assist for toilet transfers and educated husband concerning the need for close guarding with all ADL. Pt and husband additionally educated on not wearing brace during sleep or in bed and they verbalize understanding. D/C plan remains appropriate. OT will continue to follow while admitted.    Follow Up Recommendations  Home health OT;Supervision/Assistance - 24 hour    Equipment Recommendations  Other (comment) (AE kit)    Recommendations for Other Services      Precautions / Restrictions Precautions Precautions: Fall;Back Precaution Booklet Issued: Yes (comment) Precaution Comments: Pt able to verbalize 3/3 precautions Required Braces or Orthoses:  ("no brace needed" per orders but pt wearing lumbar brace ) Restrictions Weight Bearing Restrictions: No       Mobility Bed Mobility Overal bed mobility: Needs Assistance Bed Mobility: Rolling;Sidelying to Sit;Sit to Sidelying Rolling: Min guard Sidelying to sit: Min assist     Sit to sidelying: Min assist General bed mobility comments: Min assist for raising trunk from bed on sidelying to sit and raising B LE into bed.   Transfers Overall transfer level: Needs assistance Equipment used: Rolling walker (2 wheeled) Transfers: Sit to/from Stand Sit to Stand: Min guard Stand pivot transfers: Min guard       General transfer comment: Min guard assist for steadying and pt with good demonstration of safe hand  placement.     Balance Overall balance assessment: Needs assistance Sitting-balance support: Feet supported Sitting balance-Leahy Scale: Fair     Standing balance support: During functional activity;Bilateral upper extremity supported Standing balance-Leahy Scale: Fair                             ADL either performed or assessed with clinical judgement   ADL Overall ADL's : Needs assistance/impaired                         Toilet Transfer: Min Geophysical data processor Details (indicate cue type and reason): Educated husband on need for close guarding to maximize safety.          Functional mobility during ADLs: Min guard;Rolling walker General ADL Comments: Pt wearing brace on arrival and in process of lying down. Educated pt concerning not wearing brace when laying down and sleeping. Educated pt and husband concerning need for close guarding for safety. Educated on log roll technique and pt requiring hands on assist to prevent twisting. Educated husband on methods to assist.     Vision   Vision Assessment?: No apparent visual deficits   Perception     Praxis      Cognition Arousal/Alertness: Awake/alert Behavior During Therapy: WFL for tasks assessed/performed Overall Cognitive Status: Within Functional Limits for tasks assessed  Exercises     Shoulder Instructions       General Comments Husband present and very attentive to learning techniques for assistance.     Pertinent Vitals/ Pain       Pain Assessment: Faces Faces Pain Scale: Hurts little more Pain Location:  low back Pain Descriptors / Indicators: Sore;Operative site guarding Pain Intervention(s): Limited activity within patient's tolerance;Monitored during session;Repositioned  Home Living                                          Prior Functioning/Environment               Frequency  Min 3X/week        Progress Toward Goals  OT Goals(current goals can now be found in the care plan section)  Progress towards OT goals: Progressing toward goals  Acute Rehab OT Goals Patient Stated Goal: return home OT Goal Formulation: With patient Time For Goal Achievement: 08/22/16 Potential to Achieve Goals: Good ADL Goals Pt Will Perform Lower Body Bathing: with min guard assist;with adaptive equipment;sitting/lateral leans Pt Will Perform Lower Body Dressing: with min assist;with caregiver independent in assisting;with adaptive equipment;sit to/from stand Pt Will Transfer to Toilet: with supervision;ambulating;grab bars (with RW, caregiver independent in assisting.) Pt Will Perform Toileting - Clothing Manipulation and hygiene: with supervision;sit to/from stand Additional ADL Goal #1: Pt will recall 3/3 back precautions at the beginning of the session and need 3 or less vc to maintain during the session Additional ADL Goal #2: Pt will perform bed mobility at supervision level, while maintaining back precautions  Plan Discharge plan remains appropriate    Co-evaluation                 AM-PAC PT "6 Clicks" Daily Activity     Outcome Measure   Help from another person eating meals?: None Help from another person taking care of personal grooming?: A Little Help from another person toileting, which includes using toliet, bedpan, or urinal?: A Little Help from another person bathing (including washing, rinsing, drying)?: A Lot Help from another person to put on and taking off regular upper body clothing?: A Little Help from another person to put on and taking off regular lower body clothing?: A Lot 6 Click Score: 17    End of Session Equipment Utilized During Treatment: Rolling walker;Back brace (pt wearing back brace on OT arrival)  OT Visit Diagnosis: Unsteadiness on feet (R26.81);Muscle weakness (generalized) (M62.81);Other symptoms and signs  involving the nervous system (R29.898);Pain Pain - Right/Left: Right Pain - part of body: Leg (back)   Activity Tolerance Patient tolerated treatment well   Patient Left in chair;with call bell/phone within reach;with chair alarm set;with family/visitor present   Nurse Communication Mobility status        Time: 1340-1400 OT Time Calculation (min): 20 min  Charges: OT General Charges $OT Visit: 1 Procedure OT Treatments $Self Care/Home Management : 8-22 mins  Norman Herrlich, MS OTR/L  Pager: Tillson A Irasema Chalk 08/11/2016, 3:03 PM

## 2016-08-11 NOTE — Care Management Note (Signed)
Case Management Note  Patient Details  Name: Tricia Castillo MRN: [id number] Date of Birth: 09/17/1933  Subjective/Objective:                    Action/Plan: Pt discharging home with orders for [facility] services. CM met with the patient and provided her a list of [facility] agencies. She selected [facility]. [name] with [employer]. [name] to run her insurance in am and notify CM.   Pt states she has transportation home.   Expected Discharge Date:  08/11/16               Expected Discharge Plan:  [place]  In-House Referral:     Discharge planning Services  CM Consult  Post Acute Care Choice:  Home Health Choice offered to:  Patient  DME Arranged:    DME Agency:     HH Arranged:  PT, OT HH Agency:  [facility]  Status of Service:  Completed, signed off  If discussed at [place] of Stay Meetings, dates discussed:    Additional Comments:  Pollie Friar, RN 08/11/2016, 10:25 AM

## 2016-08-11 NOTE — Progress Notes (Signed)
Patient bladder scan before voiding has and voided .post voidal bladder scan was . Will keep monitoring.

## 2016-08-12 NOTE — Care Management Note (Addendum)
Case Management Note  Patient Details  Name: Tricia Castillo MRN: [id number] Date of Birth: [birth year]  Subjective/Objective:                    Action/Plan: 08/12/2016 at 1104am:  [facility] not in network with [employer]. [facility] and [facility] are able to [facility] the patient. [facility] and [facility] was good with change.    Expected Discharge Date:  08/11/16               Expected Discharge Plan:  Home w Home Health Services  In-House Referral:     Discharge planning Services  CM Consult  Post Acute Care Choice:  Home Health Choice offered to:  Patient  DME Arranged:    DME Agency:     HH Arranged:  PT, OT HH Agency:  [facility]  Status of Service:  Completed, signed off  If discussed at Long Length of Stay Meetings, dates discussed:    Additional Comments:  Kermit BaloKelli F Kameran Lallier, RN 08/12/2016, 11:03 AM

## 2016-08-15 NOTE — Addendum Note (Signed)
Addendum  created 08/15/16 1303 by Hollis, Kevin D, MD   Sign clinical note    

## 2016-08-15 NOTE — Anesthesia Postprocedure Evaluation (Signed)
Anesthesia Post Note  Patient: Tricia Castillo  Procedure(s) Performed: Procedure(s) (LRB): POSTERIOR LUMBAR INTERBODY FUSION LUMBAR FOUR-FIVE (N/A)     Anesthesia Post Evaluation  Last Vitals:  Vitals:   08/11/16 0428 08/11/16 0915  BP: 128/63 123/79  Pulse: 82 80  Resp: 17 16  Temp: 36.6 C 36.7 C    Last Pain:  Vitals:   08/11/16 1004  TempSrc:   PainSc: 3                  Shelton SilvasKevin D Kylina Vultaggio

## 2017-06-06 ENCOUNTER — Other Ambulatory Visit (HOSPITAL_BASED_OUTPATIENT_CLINIC_OR_DEPARTMENT_OTHER): Payer: Self-pay | Admitting: Neurosurgery

## 2017-06-06 DIAGNOSIS — M5440 Lumbago with sciatica, unspecified side: Secondary | ICD-10-CM

## 2017-06-13 ENCOUNTER — Ambulatory Visit (HOSPITAL_BASED_OUTPATIENT_CLINIC_OR_DEPARTMENT_OTHER)
Admission: RE | Admit: 2017-06-13 | Discharge: 2017-06-13 | Disposition: A | Discharge status: Home / Self Care | Payer: Medicare HMO | Source: Ambulatory Visit | Attending: Neurosurgery | Admitting: Neurosurgery

## 2017-06-13 DIAGNOSIS — M48061 Spinal stenosis, lumbar region without neurogenic claudication: Secondary | ICD-10-CM | POA: Diagnosis not present

## 2017-06-13 DIAGNOSIS — Z981 Arthrodesis status: Secondary | ICD-10-CM | POA: Diagnosis not present

## 2017-06-13 DIAGNOSIS — M5440 Lumbago with sciatica, unspecified side: Secondary | ICD-10-CM | POA: Insufficient documentation

## 2017-06-13 DIAGNOSIS — M4316 Spondylolisthesis, lumbar region: Secondary | ICD-10-CM | POA: Diagnosis not present

## 2019-06-21 IMAGING — MR MR LUMBAR SPINE W/O CM
6 series · 37 of 48 positions shown · non-contrast
Comparison: Lumbar spine radiograph March 03, 2017 and MRI of
lumbar spine May 02, 2016

CLINICAL DATA: LEFT hip pain radiating to LEFT leg with numbness
and weakness for 2 months. Status post L4-5 E fusion 1 year prior.

EXAM:
MRI LUMBAR SPINE WITHOUT CONTRAST
TECHNIQUE: Multiplanar, multisequence MR imaging of the lumbar spine was
performed. No intravenous contrast was administered.

[Series 3: T1 · sagittal · 4.0mm · 1.02mm/px · 6 of 17 slices shown (1 of 2)]
[im 1/17]
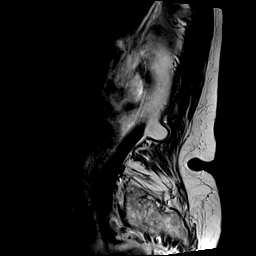
[im 4/17]
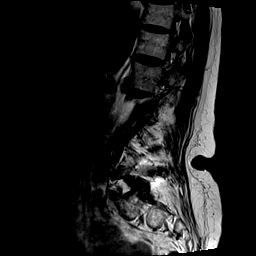
[im 7/17]
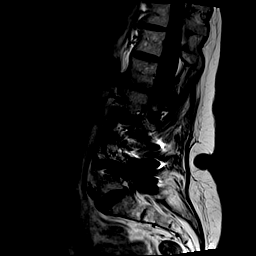
[im 10/17]
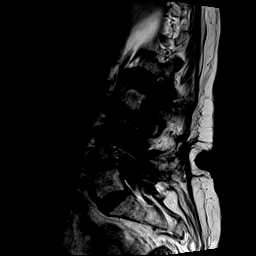
[im 13/17]
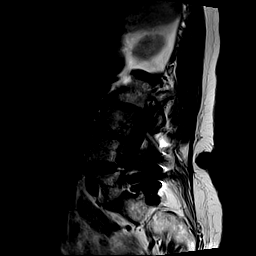
[im 17/17]
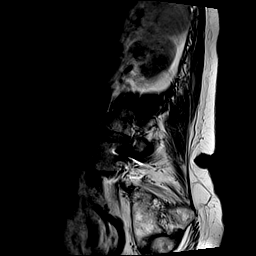

[Series 4: T2 · sagittal · 4.0mm · 1.02mm/px · 6 of 17 slices shown (1 of 2)]
[im 1/17]
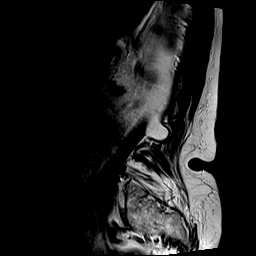
[im 4/17]
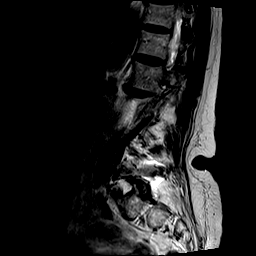
[im 7/17]
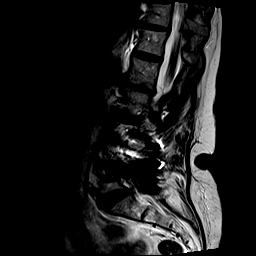
[im 10/17]
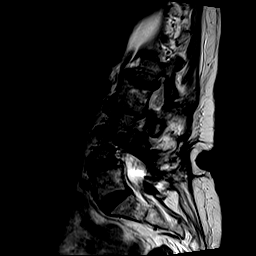
[im 13/17]
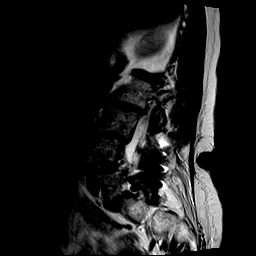
[im 17/17]
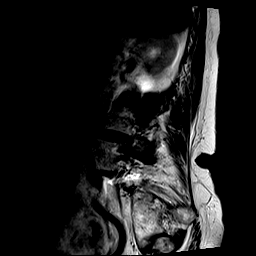

[Series 5: STIR · sagittal · 4.0mm · 1.02mm/px · 6 of 17 slices shown]
[im 1/17]
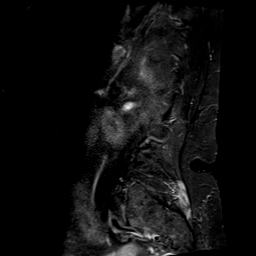
[im 4/17]
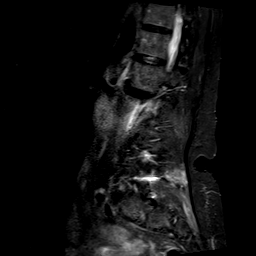
[im 7/17]
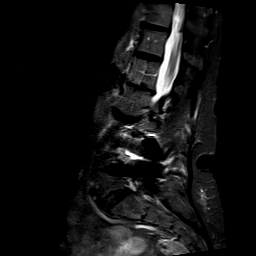
[im 10/17]
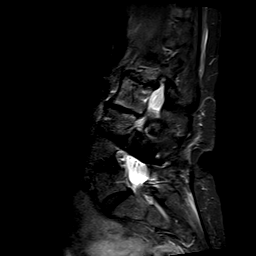
[im 13/17]
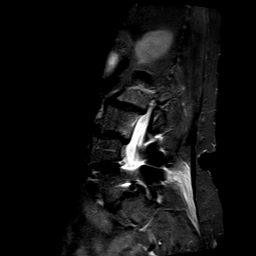
[im 17/17]
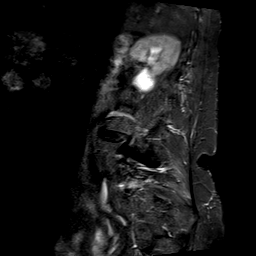

[Series 6: T2 · axial · 4.0mm · 0.39mm/px · z∈[-12,+190]mm · 9 of 37 slices shown (2 of 2)]
[im 1/37]
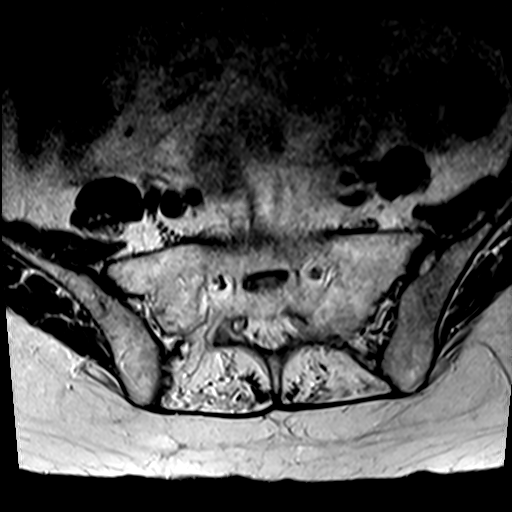
[im 7/37]
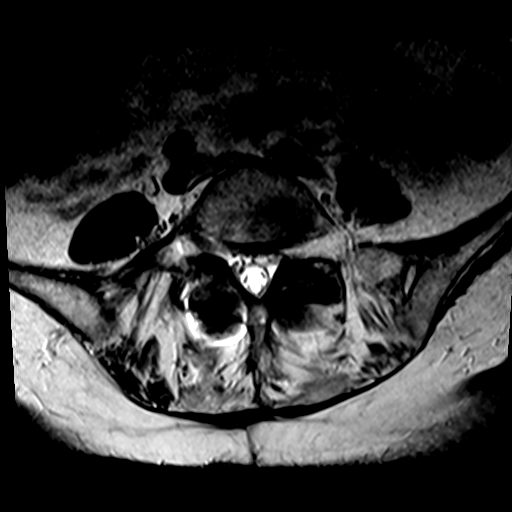
[im 10/37]
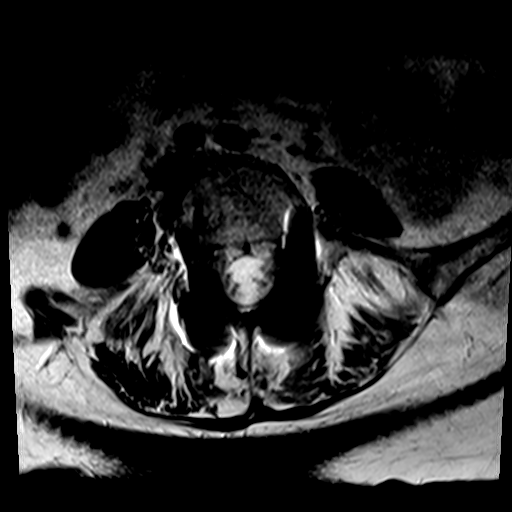
[im 17/37]
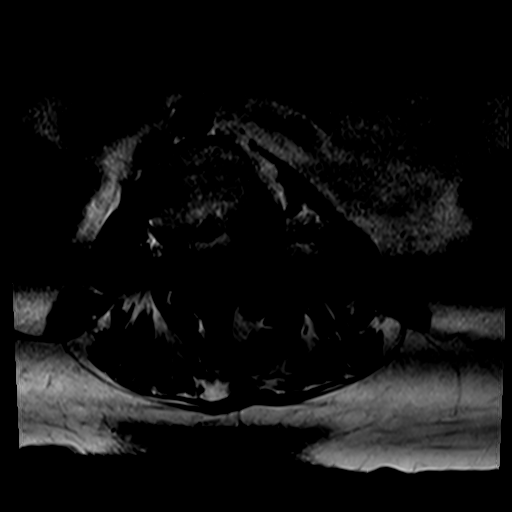
[im 20/37]
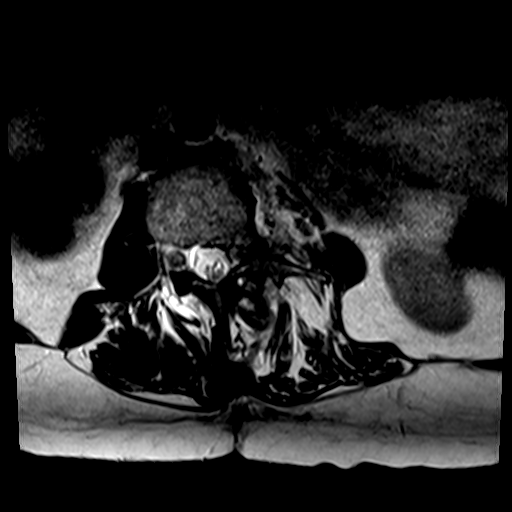
[im 27/37]
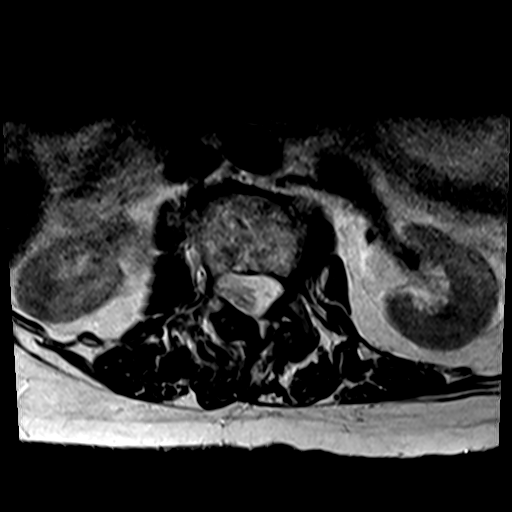
[im 30/37]
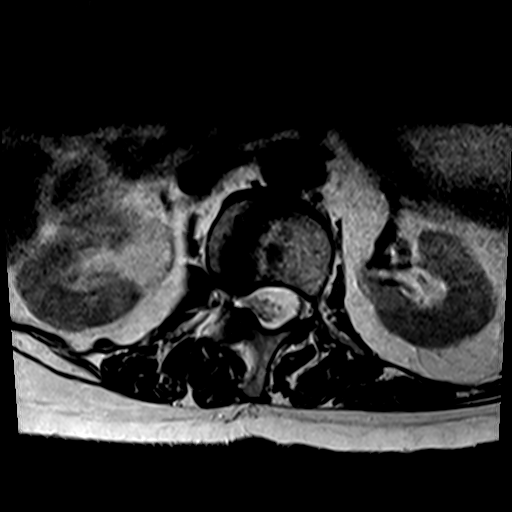
[im 33/37]
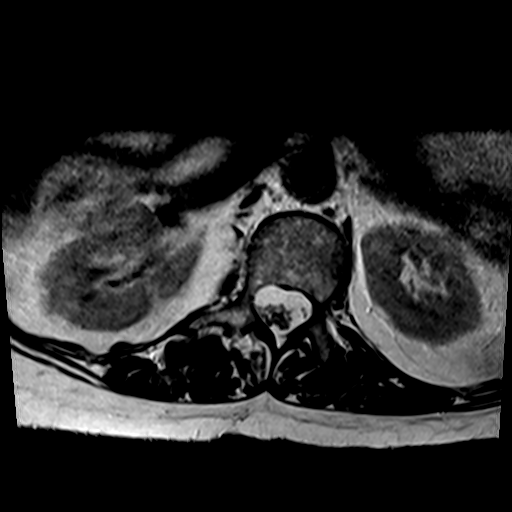
[im 37/37]
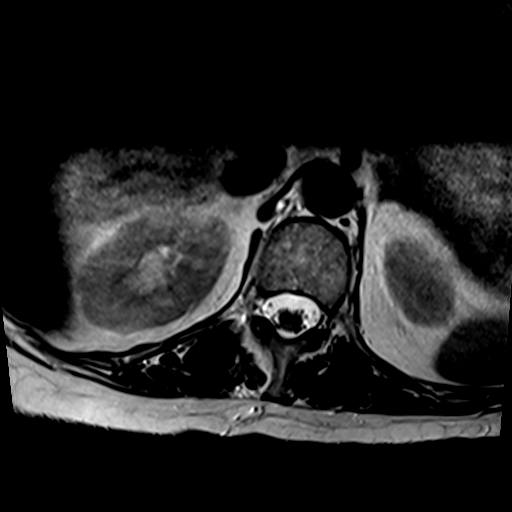

[Series 7: T1 · axial · 4.0mm · 0.78mm/px · z∈[-12,+190]mm · 9 of 37 slices shown (2 of 2)]
[im 1/37]
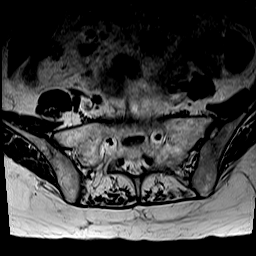
[im 7/37]
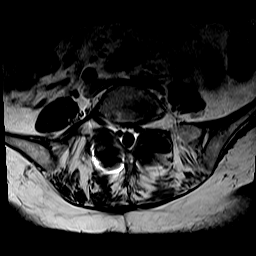
[im 10/37]
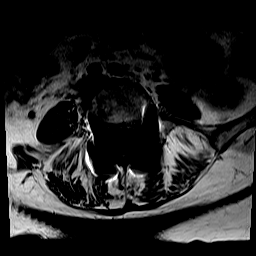
[im 17/37]
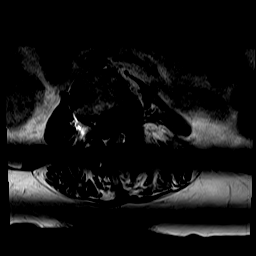
[im 20/37]
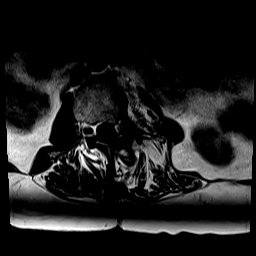
[im 27/37]
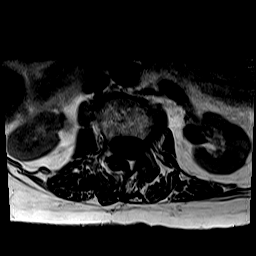
[im 30/37]
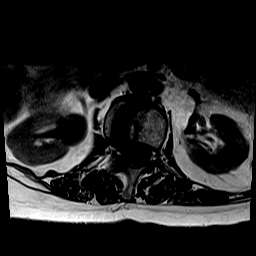
[im 33/37]
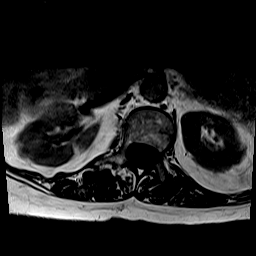
[im 37/37]
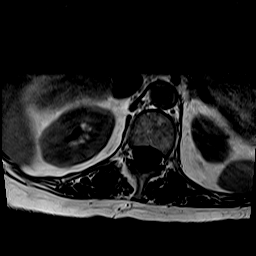

[Series 100: hx · axial · 10.0mm · 0.62mm/px · 1 of 18 slices shown]
[im 1/18]
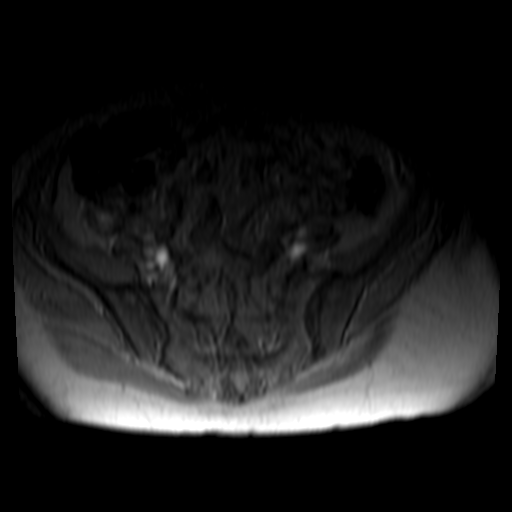

[37 of 48 positions shown; findings below may reference images not displayed]

FINDINGS: SEGMENTATION: For the purposes of this report, the last well-formed
intervertebral disc is reported as L5-S1.

ALIGNMENT: Grade 1 L4-5 anterolisthesis, similar. Worsening L2-3 and
L3-4 retrolisthesis.

VERTEBRAE:Status post L4-5 PLIF. The LEFT L4 pedicle screw is at the
superior cortex though, limited localization due to hardware
susceptibility artifact. Severe L1-2 through L3-4 disc height loss
associated with scoliosis. Mild L5-S1 disc height loss. Multilevel
disc desiccation. Mild chronic discogenic endplate changes L1-2
through L3-4; mild acute component toward the LEFT at L1-2 and L2-3.

CONUS MEDULLARIS AND CAUDA EQUINA: Conus medullaris terminates at
T12-L1 and demonstrates normal morphology and signal
characteristics. Cauda equina is normal.

PARASPINAL AND OTHER SOFT TISSUES: Severe paraspinal muscle
atrophy/denervation at and below the level surgical intervention.
Chronic mild we atrophic LEFT iliopsoas muscle.

DISC LEVELS:

L1-2: T12-L1: No disc bulge, canal stenosis nor neural foraminal
narrowing.

L1-2: Small broad-based disc osteophyte complex asymmetric to the
RIGHT. Mild facet arthropathy and ligamentum flavum redundancy
without canal stenosis. Mild bilateral neural foraminal narrowing.

L2-3: Retrolisthesis. Tiny similar central disc extrusion. Moderate
LEFT facet arthropathy. Mild canal stenosis with LEFT lateral recess
effacement medially displacing the traversing LEFT L3 nerve. Severe
LEFT neural foraminal narrowing.

L3-4: Retrolisthesis. Limited by hardware artifact. Facet
arthropathy. Mild canal stenosis. Moderate RIGHT, severe LEFT neural
foraminal narrowing.

L4-5: PLIF. Posterior decompression. No canal stenosis. Mild LEFT
neural foraminal narrowing may be overestimated by hardware
artifact.

L5-S1: Small broad-based disc bulge. Severe RIGHT, moderate LEFT
facet arthropathy. No canal stenosis. Mild RIGHT neural foraminal
narrowing.
IMPRESSION: 1. Interval L4-5 PLIF with posterior decompression. Limited
assessment of hardware by MRI though, LEFT pedicle screw may extend
through L4 superior endplate cortex.
2. Worsening grade 1 L2-3 and L3-4 retrolisthesis. Stable grade 1
L4-5 anterolisthesis.
3. Mild canal stenosis L2-3. Neural foraminal narrowing at all
lumbar levels: Severe on the LEFT at L2-3 and L3-4.

## 2024-02-15 DEATH — deceased
